# Patient Record
Sex: Male | Born: 1965 | Race: White | Hispanic: No | Marital: Married | Smoking: Former smoker
Health system: Southern US, Community
[De-identification: ages and names within clinical notes are randomized; demographics above are authoritative.]

## PROBLEM LIST (undated history)

## (undated) DIAGNOSIS — Z8719 Personal history of other diseases of the digestive system: Secondary | ICD-10-CM

## (undated) DIAGNOSIS — H269 Unspecified cataract: Secondary | ICD-10-CM

## (undated) DIAGNOSIS — R569 Unspecified convulsions: Secondary | ICD-10-CM

## (undated) DIAGNOSIS — K219 Gastro-esophageal reflux disease without esophagitis: Secondary | ICD-10-CM

## (undated) DIAGNOSIS — T7840XA Allergy, unspecified, initial encounter: Secondary | ICD-10-CM

## (undated) DIAGNOSIS — F1010 Alcohol abuse, uncomplicated: Secondary | ICD-10-CM

## (undated) HISTORY — DX: Allergy, unspecified, initial encounter: T78.40XA

## (undated) HISTORY — DX: Unspecified convulsions: R56.9

## (undated) HISTORY — PX: CATARACT EXTRACTION: SUR2

## (undated) HISTORY — DX: Gastro-esophageal reflux disease without esophagitis: K21.9

## (undated) HISTORY — DX: Unspecified cataract: H26.9

## (undated) HISTORY — DX: Alcohol abuse, uncomplicated: F10.10

---

## 1999-10-06 ENCOUNTER — Encounter: Admission: RE | Admit: 1999-10-06 | Discharge: 1999-10-06 | Payer: Self-pay | Admitting: Emergency Medicine

## 1999-10-06 ENCOUNTER — Encounter: Payer: Self-pay | Admitting: Emergency Medicine

## 2007-03-02 ENCOUNTER — Encounter: Admission: RE | Admit: 2007-03-02 | Discharge: 2007-03-02 | Payer: Self-pay | Admitting: Emergency Medicine

## 2009-01-19 ENCOUNTER — Inpatient Hospital Stay (HOSPITAL_COMMUNITY): Admission: EM | Admit: 2009-01-19 | Discharge: 2009-02-06 | Payer: Self-pay | Admitting: Emergency Medicine

## 2009-01-19 HISTORY — PX: OTHER SURGICAL HISTORY: SHX169

## 2009-01-27 ENCOUNTER — Ambulatory Visit: Payer: Self-pay | Admitting: Physical Medicine & Rehabilitation

## 2009-01-29 ENCOUNTER — Encounter (INDEPENDENT_AMBULATORY_CARE_PROVIDER_SITE_OTHER): Payer: Self-pay | Admitting: General Surgery

## 2009-01-29 ENCOUNTER — Ambulatory Visit: Payer: Self-pay | Admitting: Vascular Surgery

## 2009-02-05 ENCOUNTER — Encounter (INDEPENDENT_AMBULATORY_CARE_PROVIDER_SITE_OTHER): Payer: Self-pay | Admitting: General Surgery

## 2009-02-06 ENCOUNTER — Ambulatory Visit: Payer: Self-pay | Admitting: Physical Medicine & Rehabilitation

## 2009-02-06 ENCOUNTER — Inpatient Hospital Stay (HOSPITAL_COMMUNITY)
Admission: RE | Admit: 2009-02-06 | Discharge: 2009-03-10 | Payer: Self-pay | Admitting: Physical Medicine & Rehabilitation

## 2009-03-16 ENCOUNTER — Observation Stay (HOSPITAL_COMMUNITY): Admission: EM | Admit: 2009-03-16 | Discharge: 2009-03-17 | Payer: Self-pay | Admitting: Emergency Medicine

## 2009-03-18 ENCOUNTER — Encounter
Admission: RE | Admit: 2009-03-18 | Discharge: 2009-06-16 | Payer: Self-pay | Admitting: Physical Medicine & Rehabilitation

## 2009-04-04 ENCOUNTER — Emergency Department (HOSPITAL_COMMUNITY): Admission: EM | Admit: 2009-04-04 | Discharge: 2009-04-04 | Payer: Self-pay | Admitting: Emergency Medicine

## 2009-04-07 ENCOUNTER — Encounter
Admission: RE | Admit: 2009-04-07 | Discharge: 2009-04-08 | Payer: Self-pay | Admitting: Physical Medicine & Rehabilitation

## 2009-04-08 ENCOUNTER — Ambulatory Visit: Payer: Self-pay | Admitting: Physical Medicine & Rehabilitation

## 2009-04-10 ENCOUNTER — Emergency Department (HOSPITAL_COMMUNITY): Admission: EM | Admit: 2009-04-10 | Discharge: 2009-04-10 | Payer: Self-pay | Admitting: Emergency Medicine

## 2009-05-13 ENCOUNTER — Ambulatory Visit: Payer: Self-pay | Admitting: Vascular Surgery

## 2009-07-08 ENCOUNTER — Encounter
Admission: RE | Admit: 2009-07-08 | Discharge: 2009-09-30 | Payer: Self-pay | Admitting: Physical Medicine & Rehabilitation

## 2009-07-08 ENCOUNTER — Ambulatory Visit: Payer: Self-pay | Admitting: Physical Medicine & Rehabilitation

## 2009-08-24 ENCOUNTER — Ambulatory Visit: Payer: Self-pay | Admitting: Psychology

## 2009-09-30 ENCOUNTER — Ambulatory Visit: Payer: Self-pay | Admitting: Physical Medicine & Rehabilitation
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
## 2010-10-08 LAB — URINALYSIS, ROUTINE W REFLEX MICROSCOPIC
Bilirubin Urine: NEGATIVE
Glucose, UA: NEGATIVE mg/dL
Glucose, UA: NEGATIVE mg/dL
Hgb urine dipstick: NEGATIVE
Specific Gravity, Urine: 1.015 (ref 1.005–1.030)
Specific Gravity, Urine: 1.02 (ref 1.005–1.030)
pH: 5.5 (ref 5.0–8.0)

## 2010-10-08 LAB — DIFFERENTIAL
Basophils Relative: 1 % (ref 0–1)
Lymphocytes Relative: 16 % (ref 12–46)
Lymphs Abs: 1.8 10*3/uL (ref 0.7–4.0)
Monocytes Absolute: 0.7 10*3/uL (ref 0.1–1.0)
Monocytes Relative: 6 % (ref 3–12)
Neutro Abs: 8.3 10*3/uL — ABNORMAL HIGH (ref 1.7–7.7)

## 2010-10-08 LAB — RAPID URINE DRUG SCREEN, HOSP PERFORMED
Amphetamines: NOT DETECTED
Cocaine: NOT DETECTED
Opiates: NOT DETECTED
Tetrahydrocannabinol: NOT DETECTED

## 2010-10-08 LAB — COMPREHENSIVE METABOLIC PANEL
Albumin: 3.5 g/dL (ref 3.5–5.2)
Alkaline Phosphatase: 131 U/L — ABNORMAL HIGH (ref 39–117)
BUN: 12 mg/dL (ref 6–23)
Potassium: 3.5 mEq/L (ref 3.5–5.1)
Sodium: 141 mEq/L (ref 135–145)
Total Protein: 6.6 g/dL (ref 6.0–8.3)

## 2010-10-08 LAB — URINE MICROSCOPIC-ADD ON

## 2010-10-08 LAB — CBC
HCT: 41.3 % (ref 39.0–52.0)
Platelets: 357 10*3/uL (ref 150–400)
RDW: 13.9 % (ref 11.5–15.5)

## 2010-10-08 LAB — URINE CULTURE
Colony Count: NO GROWTH
Culture: NO GROWTH

## 2010-10-08 LAB — APTT: aPTT: 27 seconds (ref 24–37)

## 2010-10-09 LAB — COMPREHENSIVE METABOLIC PANEL
ALT: 114 U/L — ABNORMAL HIGH (ref 0–53)
AST: 51 U/L — ABNORMAL HIGH (ref 0–37)
Albumin: 3.2 g/dL — ABNORMAL LOW (ref 3.5–5.2)
Alkaline Phosphatase: 357 U/L — ABNORMAL HIGH (ref 39–117)
GFR calc Af Amer: >60 mL/min (ref >60)
Glucose, Bld: 85 mg/dL (ref 70–99)
Potassium: 4 mEq/L (ref 3.5–5.1)
Sodium: 137 mEq/L (ref 135–145)
Total Protein: 6.9 g/dL (ref 6.0–8.3)

## 2010-10-09 LAB — BASIC METABOLIC PANEL
BUN: 10 mg/dL (ref 6–23)
BUN: 10 mg/dL (ref 6–23)
BUN: 10 mg/dL (ref 6–23)
BUN: 9 mg/dL (ref 6–23)
CO2: 27 mEq/L (ref 19–32)
CO2: 28 mEq/L (ref 19–32)
CO2: 28 mEq/L (ref 19–32)
CO2: 28 mEq/L (ref 19–32)
Chloride: 100 mEq/L (ref 96–112)
Chloride: 100 mEq/L (ref 96–112)
Chloride: 100 mEq/L (ref 96–112)
Chloride: 97 mEq/L (ref 96–112)
Creatinine, Ser: 0.58 mg/dL (ref 0.4–1.5)
Creatinine, Ser: 0.6 mg/dL (ref 0.4–1.5)
GFR calc Af Amer: >60 mL/min (ref >60)
Glucose, Bld: 102 mg/dL — ABNORMAL HIGH (ref 70–99)
Glucose, Bld: 144 mg/dL — ABNORMAL HIGH (ref 70–99)
Potassium: 4.1 mEq/L (ref 3.5–5.1)
Potassium: 4.2 mEq/L (ref 3.5–5.1)
Potassium: 4.3 mEq/L (ref 3.5–5.1)

## 2010-10-09 LAB — URINALYSIS, ROUTINE W REFLEX MICROSCOPIC
Glucose, UA: NEGATIVE mg/dL
Hgb urine dipstick: NEGATIVE
Ketones, ur: NEGATIVE mg/dL
Protein, ur: NEGATIVE mg/dL
pH: 6.5 (ref 5.0–8.0)

## 2010-10-09 LAB — MRSA CULTURE

## 2010-10-09 LAB — DIFFERENTIAL
Basophils Relative: 4 % — ABNORMAL HIGH (ref 0–1)
Eosinophils Absolute: 0.6 10*3/uL (ref 0.0–0.7)
Eosinophils Relative: 8 % — ABNORMAL HIGH (ref 0–5)
Monocytes Absolute: 0.8 10*3/uL (ref 0.1–1.0)
Monocytes Relative: 9 % (ref 3–12)

## 2010-10-09 LAB — HEPATIC FUNCTION PANEL
ALT: 128 U/L — ABNORMAL HIGH (ref 0–53)
Bilirubin, Direct: 0.1 mg/dL (ref 0.0–0.3)
Indirect Bilirubin: 0.7 mg/dL (ref 0.3–0.9)

## 2010-10-09 LAB — CBC
HCT: 38.5 % — ABNORMAL LOW (ref 39.0–52.0)
Hemoglobin: 13.1 g/dL (ref 13.0–17.0)
Hemoglobin: 14.6 g/dL (ref 13.0–17.0)
MCHC: 33.9 g/dL (ref 30.0–36.0)
MCHC: 34.2 g/dL (ref 30.0–36.0)
MCV: 95.5 fL (ref 78.0–100.0)
Platelets: 595 10*3/uL — ABNORMAL HIGH (ref 150–400)
RBC: 4.03 MIL/uL — ABNORMAL LOW (ref 4.22–5.81)
RDW: 12.9 % (ref 11.5–15.5)
RDW: 13.3 % (ref 11.5–15.5)

## 2010-10-09 LAB — URINE CULTURE: Colony Count: >=100000

## 2010-10-09 LAB — PHENYTOIN LEVEL, TOTAL: Phenytoin Lvl: 4.4 ug/mL — ABNORMAL LOW (ref 10.0–20.0)

## 2010-10-10 LAB — BASIC METABOLIC PANEL
CO2: 24 mEq/L (ref 19–32)
CO2: 24 mEq/L (ref 19–32)
CO2: 24 mEq/L (ref 19–32)
CO2: 25 mEq/L (ref 19–32)
Calcium: 8 mg/dL — ABNORMAL LOW (ref 8.4–10.5)
Calcium: 8.1 mg/dL — ABNORMAL LOW (ref 8.4–10.5)
Calcium: 8.5 mg/dL (ref 8.4–10.5)
Chloride: 102 mEq/L (ref 96–112)
Chloride: 111 mEq/L (ref 96–112)
Chloride: 113 mEq/L — ABNORMAL HIGH (ref 96–112)
Creatinine, Ser: 0.74 mg/dL (ref 0.4–1.5)
Creatinine, Ser: 0.92 mg/dL (ref 0.4–1.5)
Creatinine, Ser: 0.92 mg/dL (ref 0.4–1.5)
Creatinine, Ser: 1.02 mg/dL (ref 0.4–1.5)
GFR calc Af Amer: >60 mL/min (ref >60)
GFR calc Af Amer: >60 mL/min (ref >60)
GFR calc Af Amer: >60 mL/min (ref >60)
GFR calc Af Amer: >60 mL/min (ref >60)
GFR calc non Af Amer: >60 mL/min (ref >60)
GFR calc non Af Amer: >60 mL/min (ref >60)
GFR calc non Af Amer: >60 mL/min (ref >60)
Glucose, Bld: 105 mg/dL — ABNORMAL HIGH (ref 70–99)
Glucose, Bld: 114 mg/dL — ABNORMAL HIGH (ref 70–99)
Glucose, Bld: 138 mg/dL — ABNORMAL HIGH (ref 70–99)
Glucose, Bld: 181 mg/dL — ABNORMAL HIGH (ref 70–99)
Potassium: 3.6 mEq/L (ref 3.5–5.1)
Potassium: 3.7 mEq/L (ref 3.5–5.1)
Sodium: 135 mEq/L (ref 135–145)
Sodium: 142 mEq/L (ref 135–145)
Sodium: 143 mEq/L (ref 135–145)

## 2010-10-10 LAB — DIFFERENTIAL
Basophils Absolute: 0 10*3/uL (ref 0.0–0.1)
Basophils Relative: 0 % (ref 0–1)
Eosinophils Absolute: 0.8 10*3/uL — ABNORMAL HIGH (ref 0.0–0.7)
Lymphs Abs: 1 10*3/uL (ref 0.7–4.0)
Neutro Abs: 11.2 10*3/uL — ABNORMAL HIGH (ref 1.7–7.7)

## 2010-10-10 LAB — GLUCOSE, CAPILLARY
Glucose-Capillary: 100 mg/dL — ABNORMAL HIGH (ref 70–99)
Glucose-Capillary: 101 mg/dL — ABNORMAL HIGH (ref 70–99)
Glucose-Capillary: 106 mg/dL — ABNORMAL HIGH (ref 70–99)
Glucose-Capillary: 106 mg/dL — ABNORMAL HIGH (ref 70–99)
Glucose-Capillary: 107 mg/dL — ABNORMAL HIGH (ref 70–99)
Glucose-Capillary: 109 mg/dL — ABNORMAL HIGH (ref 70–99)
Glucose-Capillary: 109 mg/dL — ABNORMAL HIGH (ref 70–99)
Glucose-Capillary: 116 mg/dL — ABNORMAL HIGH (ref 70–99)
Glucose-Capillary: 119 mg/dL — ABNORMAL HIGH (ref 70–99)
Glucose-Capillary: 121 mg/dL — ABNORMAL HIGH (ref 70–99)
Glucose-Capillary: 122 mg/dL — ABNORMAL HIGH (ref 70–99)
Glucose-Capillary: 123 mg/dL — ABNORMAL HIGH (ref 70–99)
Glucose-Capillary: 124 mg/dL — ABNORMAL HIGH (ref 70–99)
Glucose-Capillary: 127 mg/dL — ABNORMAL HIGH (ref 70–99)
Glucose-Capillary: 133 mg/dL — ABNORMAL HIGH (ref 70–99)
Glucose-Capillary: 133 mg/dL — ABNORMAL HIGH (ref 70–99)
Glucose-Capillary: 136 mg/dL — ABNORMAL HIGH (ref 70–99)
Glucose-Capillary: 143 mg/dL — ABNORMAL HIGH (ref 70–99)
Glucose-Capillary: 158 mg/dL — ABNORMAL HIGH (ref 70–99)
Glucose-Capillary: 159 mg/dL — ABNORMAL HIGH (ref 70–99)
Glucose-Capillary: 93 mg/dL (ref 70–99)
Glucose-Capillary: 93 mg/dL (ref 70–99)
Glucose-Capillary: 96 mg/dL (ref 70–99)

## 2010-10-10 LAB — BLOOD GAS, ARTERIAL
Acid-Base Excess: 1.6 mmol/L (ref 0.0–2.0)
Acid-Base Excess: 2.7 mmol/L — ABNORMAL HIGH (ref 0.0–2.0)
Bicarbonate: 20.3 mEq/L (ref 20.0–24.0)
Bicarbonate: 25.6 mEq/L — ABNORMAL HIGH (ref 20.0–24.0)
Bicarbonate: 27.6 mEq/L — ABNORMAL HIGH (ref 20.0–24.0)
Drawn by: 249101
Drawn by: 290241
Drawn by: 317771
FIO2: 0.4 %
FIO2: 0.4 %
MECHVT: 600 mL
MECHVT: 600 mL
MECHVT: 620 mL
MECHVT: 620 mL
MECHVT: 620 mL
MECHVT: 620 mL
O2 Saturation: 98.4 %
O2 Saturation: 98.6 %
O2 Saturation: 98.7 %
PEEP: 5 cmH2O
PEEP: 5 cmH2O
PEEP: 5 cmH2O
PEEP: 5 cmH2O
PEEP: 5 cmH2O
Patient temperature: 101
Patient temperature: 97.2
Patient temperature: 98.6
Patient temperature: 98.6
Patient temperature: 98.6
RATE: 12 resp/min
RATE: 12 resp/min
RATE: 12 resp/min
RATE: 14 resp/min
RATE: 16 resp/min
RATE: 16 resp/min
TCO2: 21.1 mmol/L (ref 0–100)
TCO2: 26.9 mmol/L (ref 0–100)
TCO2: 27.3 mmol/L (ref 0–100)
TCO2: 29.2 mmol/L (ref 0–100)
pCO2 arterial: 25.6 mmHg — ABNORMAL LOW (ref 35.0–45.0)
pCO2 arterial: 29.9 mmHg — ABNORMAL LOW (ref 35.0–45.0)
pCO2 arterial: 32.3 mmHg — ABNORMAL LOW (ref 35.0–45.0)
pCO2 arterial: 40.3 mmHg (ref 35.0–45.0)
pCO2 arterial: 48.1 mmHg — ABNORMAL HIGH (ref 35.0–45.0)
pCO2 arterial: 50.3 mmHg — ABNORMAL HIGH (ref 35.0–45.0)
pH, Arterial: 7.344 — ABNORMAL LOW (ref 7.350–7.450)
pH, Arterial: 7.359 (ref 7.350–7.450)
pH, Arterial: 7.396 (ref 7.350–7.450)
pH, Arterial: 7.419 (ref 7.350–7.450)
pH, Arterial: 7.432 (ref 7.350–7.450)
pH, Arterial: 7.484 — ABNORMAL HIGH (ref 7.350–7.450)
pH, Arterial: 7.51 — ABNORMAL HIGH (ref 7.350–7.450)
pO2, Arterial: 103 mmHg — ABNORMAL HIGH (ref 80.0–100.0)
pO2, Arterial: 123 mmHg — ABNORMAL HIGH (ref 80.0–100.0)
pO2, Arterial: 165 mmHg — ABNORMAL HIGH (ref 80.0–100.0)

## 2010-10-10 LAB — COMPREHENSIVE METABOLIC PANEL
ALT: 46 U/L (ref 0–53)
ALT: 52 U/L (ref 0–53)
AST: 43 U/L — ABNORMAL HIGH (ref 0–37)
AST: 47 U/L — ABNORMAL HIGH (ref 0–37)
Albumin: 2.7 g/dL — ABNORMAL LOW (ref 3.5–5.2)
Albumin: 3.2 g/dL — ABNORMAL LOW (ref 3.5–5.2)
Alkaline Phosphatase: 115 U/L (ref 39–117)
Alkaline Phosphatase: 51 U/L (ref 39–117)
BUN: 13 mg/dL (ref 6–23)
CO2: 25 mEq/L (ref 19–32)
Calcium: 8.1 mg/dL — ABNORMAL LOW (ref 8.4–10.5)
Chloride: 105 mEq/L (ref 96–112)
Chloride: 105 mEq/L (ref 96–112)
Creatinine, Ser: 0.97 mg/dL (ref 0.4–1.5)
GFR calc Af Amer: >60 mL/min (ref >60)
GFR calc Af Amer: >60 mL/min (ref >60)
GFR calc non Af Amer: >60 mL/min (ref >60)
Glucose, Bld: 134 mg/dL — ABNORMAL HIGH (ref 70–99)
Potassium: 3.7 mEq/L (ref 3.5–5.1)
Potassium: 4.3 mEq/L (ref 3.5–5.1)
Sodium: 134 mEq/L — ABNORMAL LOW (ref 135–145)
Sodium: 136 mEq/L (ref 135–145)
Total Bilirubin: 0.8 mg/dL (ref 0.3–1.2)
Total Bilirubin: 1 mg/dL (ref 0.3–1.2)
Total Protein: 5.5 g/dL — ABNORMAL LOW (ref 6.0–8.3)
Total Protein: 6.5 g/dL (ref 6.0–8.3)

## 2010-10-10 LAB — CBC
HCT: 26.9 % — ABNORMAL LOW (ref 39.0–52.0)
HCT: 34.8 % — ABNORMAL LOW (ref 39.0–52.0)
HCT: 36.3 % — ABNORMAL LOW (ref 39.0–52.0)
HCT: 40.7 % (ref 39.0–52.0)
Hemoglobin: 10.7 g/dL — ABNORMAL LOW (ref 13.0–17.0)
Hemoglobin: 11.8 g/dL — ABNORMAL LOW (ref 13.0–17.0)
Hemoglobin: 12.5 g/dL — ABNORMAL LOW (ref 13.0–17.0)
Hemoglobin: 12.6 g/dL — ABNORMAL LOW (ref 13.0–17.0)
Hemoglobin: 13.9 g/dL (ref 13.0–17.0)
MCHC: 33.9 g/dL (ref 30.0–36.0)
MCHC: 34.3 g/dL (ref 30.0–36.0)
MCHC: 34.5 g/dL (ref 30.0–36.0)
MCHC: 34.7 g/dL (ref 30.0–36.0)
MCHC: 34.7 g/dL (ref 30.0–36.0)
MCHC: 34.7 g/dL (ref 30.0–36.0)
MCHC: 35.1 g/dL (ref 30.0–36.0)
MCHC: 35.1 g/dL (ref 30.0–36.0)
MCV: 94.6 fL (ref 78.0–100.0)
MCV: 94.9 fL (ref 78.0–100.0)
MCV: 94.9 fL (ref 78.0–100.0)
MCV: 95 fL (ref 78.0–100.0)
MCV: 95 fL (ref 78.0–100.0)
MCV: 95.6 fL (ref 78.0–100.0)
MCV: 96.1 fL (ref 78.0–100.0)
Platelets: 162 10*3/uL (ref 150–400)
Platelets: 163 10*3/uL (ref 150–400)
Platelets: 179 10*3/uL (ref 150–400)
Platelets: 203 10*3/uL (ref 150–400)
Platelets: ADEQUATE 10*3/uL (ref 150–400)
Platelets: ADEQUATE 10*3/uL (ref 150–400)
RBC: 2.8 MIL/uL — ABNORMAL LOW (ref 4.22–5.81)
RBC: 3.21 MIL/uL — ABNORMAL LOW (ref 4.22–5.81)
RBC: 3.48 MIL/uL — ABNORMAL LOW (ref 4.22–5.81)
RBC: 3.76 MIL/uL — ABNORMAL LOW (ref 4.22–5.81)
RBC: 3.84 MIL/uL — ABNORMAL LOW (ref 4.22–5.81)
RBC: 4.26 MIL/uL (ref 4.22–5.81)
RBC: 5.07 MIL/uL (ref 4.22–5.81)
RDW: 12.9 % (ref 11.5–15.5)
RDW: 13.2 % (ref 11.5–15.5)
RDW: 13.3 % (ref 11.5–15.5)
RDW: 13.4 % (ref 11.5–15.5)
RDW: 13.4 % (ref 11.5–15.5)
WBC: 10.9 10*3/uL — ABNORMAL HIGH (ref 4.0–10.5)
WBC: 11.7 10*3/uL — ABNORMAL HIGH (ref 4.0–10.5)
WBC: 14 10*3/uL — ABNORMAL HIGH (ref 4.0–10.5)
WBC: 8.9 10*3/uL (ref 4.0–10.5)
WBC: 9.7 10*3/uL (ref 4.0–10.5)

## 2010-10-10 LAB — URINE MICROSCOPIC-ADD ON

## 2010-10-10 LAB — URINALYSIS, ROUTINE W REFLEX MICROSCOPIC
Bilirubin Urine: NEGATIVE
Ketones, ur: 15 mg/dL — AB
Nitrite: NEGATIVE
Nitrite: NEGATIVE
Specific Gravity, Urine: 1.022 (ref 1.005–1.030)
pH: 5.5 (ref 5.0–8.0)
pH: 6 (ref 5.0–8.0)

## 2010-10-10 LAB — URINE CULTURE
Colony Count: NO GROWTH
Culture: NO GROWTH

## 2010-10-10 LAB — CULTURE, RESPIRATORY W GRAM STAIN

## 2010-10-10 LAB — POCT I-STAT, CHEM 8
BUN: 14 mg/dL (ref 6–23)
Calcium, Ion: 1.07 mmol/L — ABNORMAL LOW (ref 1.12–1.32)
Chloride: 105 mEq/L (ref 96–112)
Creatinine, Ser: 1.1 mg/dL (ref 0.4–1.5)
Glucose, Bld: 145 mg/dL — ABNORMAL HIGH (ref 70–99)
Potassium: 3.8 mEq/L (ref 3.5–5.1)

## 2010-10-10 LAB — SAMPLE TO BLOOD BANK

## 2010-10-10 LAB — URINALYSIS, MICROSCOPIC ONLY
Glucose, UA: NEGATIVE mg/dL
Specific Gravity, Urine: 1.031 — ABNORMAL HIGH (ref 1.005–1.030)
pH: 6 (ref 5.0–8.0)

## 2010-10-10 LAB — CULTURE, BLOOD (ROUTINE X 2)
Culture: NO GROWTH
Culture: NO GROWTH
Culture: NO GROWTH

## 2010-10-10 LAB — PROTIME-INR
Prothrombin Time: 12.6 seconds (ref 11.6–15.2)
Prothrombin Time: 14.4 seconds (ref 11.6–15.2)

## 2010-10-10 LAB — APTT: aPTT: 25 seconds (ref 24–37)

## 2010-10-10 LAB — HEMOGLOBIN A1C: Hgb A1c MFr Bld: 5.6 % (ref 4.6–6.1)

## 2010-10-10 LAB — LACTIC ACID, PLASMA: Lactic Acid, Venous: 2.6 mmol/L — ABNORMAL HIGH (ref 0.5–2.2)
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
## 2012-04-06 HISTORY — PX: ROTATOR CUFF REPAIR: SHX139

## 2012-08-14 ENCOUNTER — Encounter: Payer: Self-pay | Admitting: Gastroenterology

## 2012-08-15 ENCOUNTER — Encounter: Payer: Self-pay | Admitting: *Deleted

## 2012-08-23 ENCOUNTER — Encounter: Payer: Self-pay | Admitting: Gastroenterology

## 2012-08-23 ENCOUNTER — Ambulatory Visit (INDEPENDENT_AMBULATORY_CARE_PROVIDER_SITE_OTHER): Payer: Managed Care, Other (non HMO) | Admitting: Gastroenterology

## 2012-08-23 ENCOUNTER — Other Ambulatory Visit (INDEPENDENT_AMBULATORY_CARE_PROVIDER_SITE_OTHER): Payer: Managed Care, Other (non HMO)

## 2012-08-23 VITALS — BP 124/94 | HR 79 | Ht 74.25 in | Wt 250.2 lb

## 2012-08-23 DIAGNOSIS — F1010 Alcohol abuse, uncomplicated: Secondary | ICD-10-CM

## 2012-08-23 DIAGNOSIS — K219 Gastro-esophageal reflux disease without esophagitis: Secondary | ICD-10-CM

## 2012-08-23 DIAGNOSIS — R079 Chest pain, unspecified: Secondary | ICD-10-CM

## 2012-08-23 LAB — FERRITIN: Ferritin: 370.3 ng/mL — ABNORMAL HIGH (ref 22.0–322.0)

## 2012-08-23 LAB — COMPREHENSIVE METABOLIC PANEL
AST: 56 U/L — ABNORMAL HIGH (ref 0–37)
Albumin: 4.2 g/dL (ref 3.5–5.2)
BUN: 15 mg/dL (ref 6–23)
CO2: 29 mEq/L (ref 19–32)
Calcium: 9.5 mg/dL (ref 8.4–10.5)
Chloride: 102 mEq/L (ref 96–112)
Creatinine, Ser: 1 mg/dL (ref 0.4–1.5)
GFR: 80.88 mL/min (ref >60.00)
Glucose, Bld: 100 mg/dL — ABNORMAL HIGH (ref 70–99)
Potassium: 4.7 mEq/L (ref 3.5–5.1)

## 2012-08-23 LAB — CBC WITH DIFFERENTIAL/PLATELET
Basophils Relative: 1.2 % (ref 0.0–3.0)
Eosinophils Absolute: 0.6 10*3/uL (ref 0.0–0.7)
Eosinophils Relative: 8.2 % — ABNORMAL HIGH (ref 0.0–5.0)
Hemoglobin: 16 g/dL (ref 13.0–17.0)
Lymphocytes Relative: 33.8 % (ref 12.0–46.0)
MCHC: 34.8 g/dL (ref 30.0–36.0)
Monocytes Relative: 10.8 % (ref 3.0–12.0)
Neutro Abs: 3.3 10*3/uL (ref 1.4–7.7)
Neutrophils Relative %: 46 % (ref 43.0–77.0)
RBC: 4.88 Mil/uL (ref 4.22–5.81)
WBC: 7.2 10*3/uL (ref 4.5–10.5)

## 2012-08-23 LAB — HEPATIC FUNCTION PANEL
Albumin: 4.2 g/dL (ref 3.5–5.2)
Alkaline Phosphatase: 64 U/L (ref 39–117)
Total Protein: 7 g/dL (ref 6.0–8.3)

## 2012-08-23 LAB — IBC PANEL
Iron: 213 ug/dL — ABNORMAL HIGH (ref 42–165)
Transferrin: 289.8 mg/dL (ref 212.0–360.0)

## 2012-08-23 LAB — VITAMIN B12: Vitamin B-12: 277 pg/mL (ref 211–911)

## 2012-08-23 LAB — FOLATE: Folate: 18.5 ng/mL (ref >5.9)
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
## 2012-08-23 NOTE — Patient Instructions (Addendum)
Your physician has requested that you go to the basement for the following lab work before leaving today: Anemia Panel and [place] [facility] Panel.  You have been scheduled for an endoscopy with propofol. Please follow written instructions given to you at your visit today. If you use inhalers (even only as needed) or a CPAP machine, please bring them with you on the day of your procedure.  Please stay on your Protonix.  You watched a video today on acid reflux  Information on acid reflux below.   ___________________________________________________________________________________________________________________  Diet for Gastroesophageal Reflux Disease, Adult Reflux (acid reflux) is when acid from your stomach flows up into the esophagus. When acid comes in contact with the esophagus, the acid causes irritation and soreness (inflammation) in the esophagus. When reflux happens often or so severely that it causes damage to the esophagus, it is called gastroesophageal reflux disease (GERD). Nutrition therapy can help ease the discomfort of GERD. FOODS OR DRINKS TO AVOID OR LIMIT  Smoking or chewing tobacco. Nicotine is one of the most potent stimulants to acid production in the gastrointestinal tract.  Caffeinated and decaffeinated coffee and black tea.  Regular or low-calorie carbonated beverages or energy drinks (caffeine-free carbonated beverages are allowed).   Strong spices, such as black pepper, white pepper, red pepper, cayenne, curry powder, and chili powder.  Peppermint or spearmint.  Chocolate.  High-fat foods, including meats and fried foods. Extra added fats including oils, butter, salad dressings, and nuts. Limit these to less than 8 tsp per day.  Fruits and vegetables if they are not tolerated, such as citrus fruits or tomatoes.  Alcohol.  Any food that seems to aggravate your condition. If you have questions regarding your diet, call your caregiver or a registered  dietitian. OTHER THINGS THAT MAY HELP GERD INCLUDE:   Eating your meals slowly, in a relaxed setting.  Eating 5 to 6 small meals per day instead of 3 large meals.  Eliminating food for a period of time if it causes distress.  Not lying down until 3 hours after eating a meal.  Keeping the head of your bed raised 6 to 9 inches (15 to 23 cm) by using a foam wedge or blocks under the legs of the bed. Lying flat may make symptoms worse.  Being physically active. Weight loss may be helpful in reducing reflux in overweight or obese adults.  Wear loose fitting clothing EXAMPLE MEAL PLAN This meal plan is approximately 2,000 calories based on https://www.[facility].org/ meal planning guidelines. Breakfast   cup cooked oatmeal.  1 cup strawberries.  1 cup low-fat milk.  1 oz almonds. Snack  1 cup cucumber slices.  6 oz yogurt (made from low-fat or fat-free milk). Lunch  2 slice whole-wheat bread.  2 oz sliced [place].  2 tsp mayonnaise.  1 cup blueberries.  1 cup snap peas. Snack  6 whole-wheat crackers.  1 oz string cheese. Dinner   cup brown rice.  1 cup mixed veggies.  1 tsp olive oil.  3 oz grilled fish. Document Released: 06/20/2005 Document Revised: 09/12/2011 Document Reviewed: 05/06/2011 [facility] Patient Information 2013 [place], [place].

## 2012-08-28 ENCOUNTER — Telehealth: Payer: Self-pay | Admitting: *Deleted

## 2012-08-28 DIAGNOSIS — R945 Abnormal results of liver function studies: Secondary | ICD-10-CM

## 2012-08-28 DIAGNOSIS — F1010 Alcohol abuse, uncomplicated: Secondary | ICD-10-CM

## 2012-08-28 DIAGNOSIS — R7989 Other specified abnormal findings of blood chemistry: Secondary | ICD-10-CM

## 2012-08-28 NOTE — Telephone Encounter (Signed)
Informed pt of lab results and that Dr Jarold Motto would like an abdominal u/s and more labs. Pt stated understanding. U/S scheduled for 08/30/12 at 0800, arrive at 0745am and NPO after midnight. Pt will try to have his labs drawn on that day as well.

## 2012-08-28 NOTE — Telephone Encounter (Signed)
Message copied by Florene Glen on Tue Aug 28, 2012  3:48 PM ------      Message from: PATTERSON, DAVID R      Created: Fri Aug 24, 2012  8:43 AM       significant elevation of his liver enzymes and needs ultrasound of his upper abdomen, hepatitis C antibody, hepatitis B surface antigen, and hemochromatosis genetics and ceruloplasmin level.  Also to [place] blood work checked CDT to determine extent of alcohol use. ------

## 2012-08-30 ENCOUNTER — Other Ambulatory Visit: Payer: Managed Care, Other (non HMO)

## 2012-08-30 ENCOUNTER — Ambulatory Visit (HOSPITAL_COMMUNITY)
Admission: RE | Admit: 2012-08-30 | Discharge: 2012-08-30 | Disposition: A | Discharge status: Home / Self Care | Payer: Managed Care, Other (non HMO) | Source: Ambulatory Visit | Attending: Gastroenterology | Admitting: Gastroenterology

## 2012-08-30 DIAGNOSIS — F1010 Alcohol abuse, uncomplicated: Secondary | ICD-10-CM

## 2012-08-30 DIAGNOSIS — Q638 Other specified congenital malformations of kidney: Secondary | ICD-10-CM | POA: Insufficient documentation

## 2012-08-30 DIAGNOSIS — R7989 Other specified abnormal findings of blood chemistry: Secondary | ICD-10-CM

## 2012-08-30 DIAGNOSIS — R945 Abnormal results of liver function studies: Secondary | ICD-10-CM

## 2012-09-01 LAB — CARBOHYDRATE DEFICIENT TRANSFERRIN
%CDT: 1.5 % (ref <2.5)
CDT: 39.2 mg/L (ref 28.1–76.0)

## 2012-09-01 LAB — CERULOPLASMIN: Ceruloplasmin: 21 mg/dL (ref 20–60)

## 2012-09-14 ENCOUNTER — Encounter: Payer: Self-pay | Admitting: Gastroenterology

## 2012-09-14 ENCOUNTER — Ambulatory Visit (AMBULATORY_SURGERY_CENTER): Payer: Managed Care, Other (non HMO) | Admitting: Gastroenterology

## 2012-09-14 VITALS — BP 138/94 | HR 74 | Temp 97.7°F | Resp 17 | Ht 74.25 in | Wt 250.0 lb

## 2012-09-14 DIAGNOSIS — K2970 Gastritis, unspecified, without bleeding: Secondary | ICD-10-CM

## 2012-09-14 DIAGNOSIS — K2990 Gastroduodenitis, unspecified, without bleeding: Secondary | ICD-10-CM

## 2012-09-14 DIAGNOSIS — F1010 Alcohol abuse, uncomplicated: Secondary | ICD-10-CM

## 2012-09-14 DIAGNOSIS — K219 Gastro-esophageal reflux disease without esophagitis: Secondary | ICD-10-CM

## 2012-09-14 MED ORDER — SODIUM CHLORIDE 0.9 % IV SOLN: 500.0000 mL | INTRAVENOUS | Status: DC

## 2012-09-14 NOTE — Progress Notes (Signed)
Called to room to assist during endoscopic procedure.  Patient ID and intended procedure confirmed with present staff. Received instructions for my participation in the procedure from the performing physician. ewm 

## 2012-09-14 NOTE — Progress Notes (Signed)
Patient did not experience any of the following events: a burn prior to discharge; a fall within the facility; wrong site/side/patient/procedure/implant event; or a hospital transfer or hospital admission upon discharge from the facility. (G8907) Patient did not have preoperative order for IV antibiotic SSI prophylaxis. (G8918)  

## 2012-09-14 NOTE — Patient Instructions (Addendum)
YOU HAD AN ENDOSCOPIC PROCEDURE TODAY AT THE [facility]: Refer to the procedure report that was given to you for any specific questions about what was found during the examination.  If the procedure report does not answer your questions, please call your gastroenterologist to clarify.  If you requested that your care partner not be given the details of your procedure findings, then the procedure report has been included in a sealed envelope for you to review at your convenience later.  YOU SHOULD EXPECT: Some feelings of bloating in the abdomen. Passage of more gas than usual.  Walking can help get rid of the air that was put into your GI tract during the procedure and reduce the bloating. If you had a lower endoscopy (such as a colonoscopy or flexible sigmoidoscopy) you may notice spotting of blood in your stool or on the toilet paper. If you underwent a bowel prep for your procedure, then you may not have a normal bowel movement for a few days.  DIET: Your first meal following the procedure should be a light meal and then it is ok to progress to your normal diet.  A half-sandwich or bowl of soup is an example of a good first meal.  Heavy or fried foods are harder to digest and may make you feel nauseous or bloated.  Likewise meals heavy in dairy and vegetables can cause extra gas to form and this can also increase the bloating.  Drink plenty of fluids but you should avoid alcoholic beverages for 24 hours.  ACTIVITY: Your care partner should take you home directly after the procedure.  You should plan to take it easy, moving slowly for the rest of the day.  You can resume normal activity the day after the procedure however you should NOT DRIVE or use heavy machinery for 24 hours (because of the sedation medicines used during the test).    SYMPTOMS TO REPORT IMMEDIATELY: A gastroenterologist can be reached at any hour.  During normal business hours, 8:30 AM to 5:00 PM Monday through Friday,  call [phone].  After hours and on weekends, please call the GI answering service at [phone] who will take a message and have the physician on call contact you.    Following upper endoscopy (EGD)  Vomiting of blood or coffee ground material  New chest pain or pain under the shoulder blades  Painful or persistently difficult swallowing  New shortness of breath  Fever of 100F or higher  Black, tarry-looking stools  FOLLOW UP: If any biopsies were taken you will be contacted by phone or by letter within the next 1-3 weeks.  Call your gastroenterologist if you have not heard about the biopsies in 3 weeks.  Our staff will call the home number listed on your records the next business day following your procedure to check on you and address any questions or concerns that you may have at that time regarding the information given to you following your procedure. This is a courtesy call and so if there is no answer at the home number and we have not heard from you through the emergency physician on call, we will assume that you have returned to your regular daily activities without incident.  SIGNATURES/CONFIDENTIALITY: You and/or your care partner have signed paperwork which will be entered into your electronic medical record.  These signatures attest to the fact that that the information above on your After Visit Summary has been reviewed and is understood.  Full   responsibility of the confidentiality of this discharge information lies with you and/or your care-partner.  Hiatal hernia information given. Gastritis information given.,  Take Nexium per Dr. Norval Gable instruction once daily,  Discontinue protonix per Dr. Jarold Motto  Appointment with Dr. Jarold Motto. October 05, 2012 at 8:45AM.

## 2012-09-14 NOTE — Op Note (Addendum)
[facility] [address] [place], [address]   ENDOSCOPY PROCEDURE REPORT  PATIENT: Justin Cooke, Justin Cooke  MR#: [id number] BIRTHDATE: [birth year] , 46  yrs. old GENDER: Male ENDOSCOPIST:David Hale Bogus, MD, [facility] REFERRED BY: Tomi Bamberger, NP PROCEDURE DATE:  09/14/2012 PROCEDURE:   EGD w/ biopsy for H.pylori ASA CLASS:    Class II INDICATIONS: History of reflux esophagitis and history of GERD. MEDICATION: propofol (Diprivan) 200mg  IV TOPICAL ANESTHETIC:  DESCRIPTION OF PROCEDURE:   After the risks and benefits of the procedure were explained, informed consent was obtained.  The LB GIF-H180 [id number]  endoscope was introduced through the mouth  and advanced to the second portion of the duodenum .  The instrument was slowly withdrawn as the mucosa was fully examined.      ESOPHAGUS: A 7-8 cm large hiatal hernia was noted.   The mucosa of the esophagus appeared normal. the mucosa of the esophagus appeared normal without evidence of Barrett's mucosa.  Stomach:  there was severe antral erythema and erosions.  CLO biopsy was done for H. pylori exam.   Retroflexed views revealed a large hiatal hernia.    The scope was then withdrawn from the patient and the procedure completed.  DUODENUM: the duodenal bulb and post bulbar areas appeared normal.  COMPLICATIONS: There were no complications.   ENDOSCOPIC IMPRESSION  :this patient has a large hiatal hernia with erosions in the hernia sac.  There is no evidence of Barrett's mucosa in the esophagus or esophageal stricturing.  Is rather marked antral erythema and erosions consistent with either NSAID damage H. pylori infection.  RECOMMENDATIONS:this patient will need long-term PPI therapy.versus fundoplication because of the large nature of this hiatal hernia. He also has erosive gastritis and erosions in the fundus of the stomach. Only were performed.  We'll continue PPI therapy for now with office followup and  consideration of fundoplication and on his clinical course.  Recent upper abdominal ultrasound exam showed a lobular echodense liver consistent with cirrhosis.  His liver function test or more than twice normal.  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  He'll be office followup per his liver disease, and probable liver biopsy.. [Recommendations]   _______________________________ eSignedMardella Layman, MD, [facility] 09/14/2012 3:25 PM Revised: 09/14/2012 3:25 PM  standard discharge

## 2012-09-17 ENCOUNTER — Telehealth: Payer: Self-pay | Admitting: *Deleted

## 2012-09-17 LAB — HELICOBACTER PYLORI SCREEN-BIOPSY: UREASE: NEGATIVE

## 2012-09-17 NOTE — Telephone Encounter (Signed)
  Follow up Call-  Call back number 09/14/2012  Post procedure Call Back phone  # [phone]  Permission to leave phone message No     Patient questions:  Do you have a fever, pain , or abdominal swelling? no Pain Score  0 *  Have you tolerated food without any problems? yes  Have you been able to return to your normal activities? yes  Do you have any questions about your discharge instructions: Diet   no Medications  no Follow up visit  no  Do you have questions or concerns about your Care? no  Actions: * If pain score is 4 or above: No action needed, pain <4.

## 2012-09-18 ENCOUNTER — Telehealth: Payer: Self-pay | Admitting: *Deleted

## 2012-09-18 NOTE — Telephone Encounter (Signed)
Called pt to schedule a f/u appt, but one has been made for 10/05/12.

## 2012-09-18 NOTE — Telephone Encounter (Signed)
Message copied by Florene Glen on Tue Sep 18, 2012  9:27 AM ------      Message from: Mardella Layman      Created: Tue Sep 18, 2012  9:24 AM       Needs ov      ----- Message -----         From: Linna Hoff, RN         Sent: 09/17/2012   4:47 PM           To: Mardella Layman, MD            Please advise on recommendations from EGD on 09/14/12; OV? Thanks.       ------

## 2012-10-05 ENCOUNTER — Encounter: Payer: Self-pay | Admitting: Gastroenterology

## 2012-10-05 ENCOUNTER — Ambulatory Visit (INDEPENDENT_AMBULATORY_CARE_PROVIDER_SITE_OTHER): Payer: Managed Care, Other (non HMO) | Admitting: Gastroenterology

## 2012-10-05 VITALS — BP 102/76 | HR 80 | Ht 74.25 in | Wt 248.2 lb

## 2012-10-05 DIAGNOSIS — K219 Gastro-esophageal reflux disease without esophagitis: Secondary | ICD-10-CM

## 2012-10-05 DIAGNOSIS — F1010 Alcohol abuse, uncomplicated: Secondary | ICD-10-CM

## 2012-10-05 DIAGNOSIS — R7989 Other specified abnormal findings of blood chemistry: Secondary | ICD-10-CM

## 2012-10-05 MED ORDER — ESOMEPRAZOLE MAGNESIUM 40 MG PO CPDR: 40.0000 mg | DELAYED_RELEASE_CAPSULE | Freq: Every day | ORAL | Status: DC

## 2012-10-05 NOTE — Patient Instructions (Addendum)
  Please take Nexium once daily thirty minutes before breakfast. New prescription sent to your pharmacy.   Please stop aspirin one week before your Liver Biopsy.  You will get a call from [facility] regarding your Liver Biopsy appointment.   Please make a follow up appointment in one month with Dr. Jarold Motto. ________________________________________________________________________________________________________________________________________________________________________________________________                                               We are excited to introduce MyChart, a new best-in-class service that provides you online access to important information in your electronic medical record. We want to make it easier for you to view your health information - all in one secure location - when and where you need it. We expect MyChart will enhance the quality of care and service we provide.  When you register for MyChart, you can:    View your test results.    Request appointments and receive appointment reminders via email.    Request medication renewals.    View your medical history, allergies, medications and immunizations.    Communicate with your physician's office through a password-protected site.    Conveniently print information such as your medication lists.  To find out if MyChart is right for you, please talk to a member of our clinical staff today. We will gladly answer your questions about this free health and wellness tool.  If you are age 63 or older and want a member of your family to have access to your record, you must provide written consent by completing a proxy form available at our office. Please speak to our clinical staff about guidelines regarding accounts for patients younger than age 81.  As you activate your MyChart account and need any technical assistance, please call the MyChart technical support line at [phone]) or email  your question to [email or web address]. If you email your question(s), please include your name, a return phone number and the best time to reach you.  If you have non-urgent health-related questions, you can send a message to our office through MyChart at [email or web address]. If you have a medical emergency, call 911.  Thank you for using MyChart as your new health and wellness resource!   MyChart licensed from [employer],  [id number]. Patents Pending.

## 2012-10-05 NOTE — Progress Notes (Signed)
This is a 47 year old Caucasian male with a long history of elevated liver enzymes and a previous history of alcohol abuse.  He continues to drink beer, but denies other alcoholic beverages.  Recent ultrasound showed changes consistent with cirrhosis but no evidence of portal hypertension or ascites.  He has severe acid reflux confirmed by recent endoscopy with a large hiatal hernia esophageal erosions.  He currently is asymptomatic in terms of any GI complaints.  Review of his labs shows that he is a heterozygote for hemachromatosis, has negative hepatitis serologies.  Family history is contributory... his mother died of" liver failure".  I suspect she had genetic hemochromatosis.   Current Medications, Allergies, Past Medical History, Past Surgical History, Family History and Social History were reviewed in [employer] record.  ROS: All systems were reviewed and are negative unless otherwise stated in the HPI.          Physical Exam: Healthy-appearing patient without stigmata of chronic liver disease.  Blood pressure 102/86, pulse 80 and regular, and weight 248 with a BMI of 31.66.  There is no hepatosplenomegaly on abdominal exam, ascites, or evidence of peripheral edema.  Mental status is normal.    Assessment and Plan: Erosive gastroesophageal reflux disease doing better on daily PPI therapy.  I've asked him to continue regular Nexium and see me in one month's time.  In the interim, we'll perform ultrasound-guided liver biopsy per radiology.  I suspect this patient has a combination of fatty liver, iron overload, and alcoholic liver disease.  He is negative for hepatitis B and C. on serology testing.  At some point patient also will need colonoscopy screening. No diagnosis found.

## 2012-10-08 ENCOUNTER — Encounter (HOSPITAL_COMMUNITY): Payer: Self-pay | Admitting: Pharmacy Technician

## 2012-10-10 ENCOUNTER — Other Ambulatory Visit: Payer: Self-pay | Admitting: Radiology

## 2012-10-11 ENCOUNTER — Other Ambulatory Visit: Payer: Self-pay | Admitting: Radiology

## 2012-10-15 ENCOUNTER — Encounter (HOSPITAL_COMMUNITY): Payer: Self-pay

## 2012-10-15 ENCOUNTER — Ambulatory Visit (HOSPITAL_COMMUNITY)
Admission: RE | Admit: 2012-10-15 | Discharge: 2012-10-15 | Disposition: A | Discharge status: Home / Self Care | Payer: Managed Care, Other (non HMO) | Source: Ambulatory Visit | Attending: Gastroenterology | Admitting: Gastroenterology

## 2012-10-15 DIAGNOSIS — K219 Gastro-esophageal reflux disease without esophagitis: Secondary | ICD-10-CM

## 2012-10-15 DIAGNOSIS — R748 Abnormal levels of other serum enzymes: Secondary | ICD-10-CM | POA: Insufficient documentation

## 2012-10-15 DIAGNOSIS — F1010 Alcohol abuse, uncomplicated: Secondary | ICD-10-CM

## 2012-10-15 DIAGNOSIS — K7689 Other specified diseases of liver: Secondary | ICD-10-CM | POA: Insufficient documentation

## 2012-10-15 HISTORY — DX: Personal history of other diseases of the digestive system: Z87.19

## 2012-10-15 LAB — APTT: aPTT: 28 seconds (ref 24–37)

## 2012-10-15 LAB — CBC
HCT: 43.3 % (ref 39.0–52.0)
Hemoglobin: 15.3 g/dL (ref 13.0–17.0)
MCH: 32.3 pg (ref 26.0–34.0)
RBC: 4.73 MIL/uL (ref 4.22–5.81)

## 2012-10-15 LAB — PROTIME-INR
INR: 0.93 (ref 0.00–1.49)
Prothrombin Time: 12.4 seconds (ref 11.6–15.2)

## 2012-10-15 MED ORDER — MIDAZOLAM HCL 2 MG/2ML IJ SOLN
INTRAMUSCULAR | Status: AC | PRN
  Administered 2012-10-15: 1 mg via INTRAVENOUS
  Administered 2012-10-15: 2 mg via INTRAVENOUS

## 2012-10-15 MED ORDER — MIDAZOLAM HCL 2 MG/2ML IJ SOLN
INTRAMUSCULAR | Status: AC
  Filled 2012-10-15: qty 6

## 2012-10-15 MED ORDER — HYDROCODONE-ACETAMINOPHEN 5-325 MG PO TABS
1.0000 | ORAL_TABLET | ORAL | Status: DC | PRN
  Filled 2012-10-15: qty 2

## 2012-10-15 MED ORDER — SODIUM CHLORIDE 0.9 % IV SOLN
Freq: Once | INTRAVENOUS | Status: AC
  Administered 2012-10-15: 08:00:00 via INTRAVENOUS

## 2012-10-15 MED ORDER — FENTANYL CITRATE 0.05 MG/ML IJ SOLN
INTRAMUSCULAR | Status: AC
  Filled 2012-10-15: qty 6

## 2012-10-15 MED ORDER — FENTANYL CITRATE 0.05 MG/ML IJ SOLN
INTRAMUSCULAR | Status: AC | PRN
  Administered 2012-10-15: 50 ug via INTRAVENOUS
  Administered 2012-10-15: 100 ug via INTRAVENOUS
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
## 2012-10-15 NOTE — Procedures (Signed)
Interventional Radiology Procedure Note  Procedure: US guided random core biopsy of liver Complications: None Recommendations: - Bedrest x 4 hrs - Clears first 2 hrs, if feeling well then ADT - Hydrocodone PRN pain  Signed,  Sterling Big, MD Vascular & Interventional Radiologist [facility]

## 2012-10-15 NOTE — H&P (Signed)
Agree with PA note.  Will proceed with random liver biopsy.  Signed,  Sterling Big, MD Vascular & Interventional Radiologist [facility]

## 2012-10-23 ENCOUNTER — Telehealth: Payer: Self-pay | Admitting: Gastroenterology

## 2012-10-23 NOTE — Telephone Encounter (Signed)
Dr Jarold Motto, please interpret the Liver Bx results for pt. Thanks.

## 2012-10-23 NOTE — Telephone Encounter (Signed)
Informed pt of bx results and Dr Norval Gable instructions for weight loss. Pt will f/u on 11/08/12 and he voiced understanding.

## 2012-10-23 NOTE — Telephone Encounter (Signed)
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

## 2012-11-08 ENCOUNTER — Ambulatory Visit (INDEPENDENT_AMBULATORY_CARE_PROVIDER_SITE_OTHER): Payer: Managed Care, Other (non HMO) | Admitting: Gastroenterology

## 2012-11-08 ENCOUNTER — Encounter: Payer: Self-pay | Admitting: Gastroenterology

## 2012-11-08 VITALS — BP 116/90 | HR 72 | Ht 74.25 in | Wt 249.0 lb

## 2012-11-08 DIAGNOSIS — K7581 Nonalcoholic steatohepatitis (NASH): Secondary | ICD-10-CM

## 2012-11-08 DIAGNOSIS — K219 Gastro-esophageal reflux disease without esophagitis: Secondary | ICD-10-CM

## 2012-11-08 DIAGNOSIS — K7689 Other specified diseases of liver: Secondary | ICD-10-CM

## 2012-11-08 MED ORDER — PANTOPRAZOLE SODIUM 40 MG PO TBEC: 40.0000 mg | DELAYED_RELEASE_TABLET | Freq: Every day | ORAL | Status: DC

## 2012-11-08 NOTE — Progress Notes (Signed)
This is a 47 year old Caucasian male who underwent recent percutaneous liver biopsy because of elevated liver transaminases.  The liver biopsy showed definite Nash syndrome, score 7 of 8 with some "fibrous portal expansion and fibrous septae with sinusoidal fibrosis.",score 2/4.  The patient remains asymptomatic without right upper quadrant pain or any systemic or specific hepatobiliary complaints.  He gained 30 pounds in weight over the last several years because of problems with rotator cuff surgery on his right shoulder, remains overweight and uses alcohol socially.  He is a heterozygote for hemachromatosis.  Workup for other causes of liver disease had been negative including hepatitis serologies.,  He does have a known hiatal hernia and chronic GERD controlled with PPI therapy.  We have given consideration of possible fundoplication, but he seems to be doing well at this time on PPI meds.  Current Medications, Allergies, Past Medical History, Past Surgical History, Family History and Social History were reviewed in [employer] record.  ROS: All systems were reviewed and are negative unless otherwise stated in the HPI.          Physical Exam:116/90, pulse 72 and regular weight 249 with a BMI of 31.75.  I cannot appreciate stigmata of chronic liver disease.  There is no hepatosplenomegaly, abdominal masses or tenderness.  Bowel sounds are normal.  There is no peripheral edema, swollen joints, and mental status is normal.    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  I have referred him to the [facility] for second opinion and consideration of placement in a clinical research trial for treatment of Nash syndrome.  Patient is aware of the benefit of weight loss in this syndrome.  Continue slow gradual weight loss programs and to keep alcohol use at a minimum.  He asked to be switched  from Nexium 40 mg a day 2 Protonix 40 mg a day for cost savings.  This patient has chronic well-controlled GERD.  Endoscopy this spring showed no evidence of varices. Please copy these notes in previous notes to his referral clinic physicians. No diagnosis found.

## 2012-11-08 NOTE — Addendum Note (Signed)
Addended by: Ok Anis A on: 11/08/2012 09:37 AM   Modules accepted: Orders

## 2012-11-08 NOTE — Patient Instructions (Addendum)
We have sent the following medications to your pharmacy for you to pick up at your convenience: Stop Nexium and start Protonix Protonix 40 mg, please take one tablet by mouth once daily.   You will be referred to [facility]. They will call you with your appointment information. There number is  [phone] Address: [address] [place] [address]

## 2013-02-01 ENCOUNTER — Other Ambulatory Visit: Payer: Self-pay | Admitting: Gastroenterology

## 2013-03-31 ENCOUNTER — Other Ambulatory Visit: Payer: Self-pay | Admitting: Gastroenterology

## 2013-05-09 ENCOUNTER — Other Ambulatory Visit: Payer: Self-pay

## 2013-05-21 ENCOUNTER — Encounter: Payer: Self-pay | Admitting: Gastroenterology

## 2013-05-22 MED ORDER — PANTOPRAZOLE SODIUM 40 MG PO TBEC: 40.0000 mg | DELAYED_RELEASE_TABLET | Freq: Every day | ORAL | Status: DC

## 2014-04-18 ENCOUNTER — Other Ambulatory Visit: Payer: Self-pay

## 2014-06-14 ENCOUNTER — Emergency Department (HOSPITAL_COMMUNITY): Payer: PRIVATE HEALTH INSURANCE

## 2014-06-14 ENCOUNTER — Emergency Department (HOSPITAL_COMMUNITY)
Admission: EM | Admit: 2014-06-14 | Discharge: 2014-06-14 | Disposition: A | Discharge status: Home / Self Care | Payer: PRIVATE HEALTH INSURANCE | Attending: Emergency Medicine | Admitting: Emergency Medicine

## 2014-06-14 ENCOUNTER — Encounter (HOSPITAL_COMMUNITY): Payer: Self-pay | Admitting: Physical Medicine and Rehabilitation

## 2014-06-14 DIAGNOSIS — Z79899 Other long term (current) drug therapy: Secondary | ICD-10-CM | POA: Diagnosis not present

## 2014-06-14 DIAGNOSIS — Z9849 Cataract extraction status, unspecified eye: Secondary | ICD-10-CM | POA: Insufficient documentation

## 2014-06-14 DIAGNOSIS — R531 Weakness: Secondary | ICD-10-CM | POA: Insufficient documentation

## 2014-06-14 DIAGNOSIS — Z7982 Long term (current) use of aspirin: Secondary | ICD-10-CM | POA: Diagnosis not present

## 2014-06-14 DIAGNOSIS — G40909 Epilepsy, unspecified, not intractable, without status epilepticus: Secondary | ICD-10-CM | POA: Insufficient documentation

## 2014-06-14 DIAGNOSIS — K219 Gastro-esophageal reflux disease without esophagitis: Secondary | ICD-10-CM | POA: Diagnosis not present

## 2014-06-14 DIAGNOSIS — Z880 Allergy status to penicillin: Secondary | ICD-10-CM | POA: Diagnosis not present

## 2014-06-14 DIAGNOSIS — Z8669 Personal history of other diseases of the nervous system and sense organs: Secondary | ICD-10-CM | POA: Insufficient documentation

## 2014-06-14 DIAGNOSIS — Z87891 Personal history of nicotine dependence: Secondary | ICD-10-CM | POA: Insufficient documentation

## 2014-06-14 DIAGNOSIS — R42 Dizziness and giddiness: Secondary | ICD-10-CM | POA: Diagnosis present

## 2014-06-14 DIAGNOSIS — R55 Syncope and collapse: Secondary | ICD-10-CM | POA: Diagnosis not present

## 2014-06-14 LAB — I-STAT CHEM 8, ED
BUN: 13 mg/dL (ref 6–23)
CALCIUM ION: 1.09 mmol/L — AB (ref 1.12–1.23)
CREATININE: 1.1 mg/dL (ref 0.50–1.35)
Chloride: 104 mEq/L (ref 96–112)
GLUCOSE: 89 mg/dL (ref 70–99)
HCT: 48 % (ref 39.0–52.0)
HEMOGLOBIN: 16.3 g/dL (ref 13.0–17.0)
Potassium: 4.4 mEq/L (ref 3.7–5.3)
SODIUM: 140 meq/L (ref 137–147)
TCO2: 25 mmol/L (ref 0–100)

## 2014-06-14 LAB — I-STAT TROPONIN, ED: Troponin i, poc: 0 ng/mL (ref 0.00–0.08)

## 2014-06-14 NOTE — Discharge Instructions (Signed)
Near-Syncope Near-syncope (commonly known as near fainting) is sudden weakness, dizziness, or feeling like you might pass out. During an episode of near-syncope, you may also develop pale skin, have tunnel vision, or feel sick to your stomach (nauseous). Near-syncope may occur when getting up after sitting or while standing for a long time. It is caused by a sudden decrease in blood flow to the brain. This decrease can result from various causes or triggers, most of which are not serious. However, because near-syncope can sometimes be a sign of something serious, a medical evaluation is required. The specific cause is often not determined. HOME CARE INSTRUCTIONS  Monitor your condition for any changes. The following actions may help to alleviate any discomfort you are experiencing:  Have someone stay with you until you feel stable.  Lie down right away and prop your feet up if you start feeling like you might faint. Breathe deeply and steadily. Wait until all the symptoms have passed. Most of these episodes last only a few minutes. You may feel tired for several hours.   Drink enough fluids to keep your urine clear or pale yellow.   If you are taking blood pressure or heart medicine, get up slowly when seated or lying down. Take several minutes to sit and then stand. This can reduce dizziness.  Follow up with your health care provider as directed. SEEK IMMEDIATE MEDICAL CARE IF:   You have a severe headache.   You have unusual pain in the chest, abdomen, or back.   You are bleeding from the mouth or rectum, or you have black or tarry stool.   You have an irregular or very fast heartbeat.   You have repeated fainting or have seizure-like jerking during an episode.   You faint when sitting or lying down.   You have confusion.   You have difficulty walking.   You have severe weakness.   You have vision problems.  MAKE SURE YOU:   Understand these instructions.  Will  watch your condition.  Will get help right away if you are not doing well or get worse. Document Released: 06/20/2005 Document Revised: 06/25/2013 Document Reviewed: 11/23/2012 ExitCare Patient Information 2015 [employer]. This information is not intended to replace advice given to you by your health care provider. Make sure you discuss any questions you have with your health care provider.  
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
## 2014-06-14 NOTE — ED Notes (Addendum)
Pt presents to department via [facility] from home for generalized weakness and dizziness, onset this afternoon while walking with wife, states sudden onset of symptoms. History of vertigo and traumatic brain injury. Pt is alert and oriented x4. Denies LOC. CBG 79. 18g LAC.

## 2015-07-06 ENCOUNTER — Encounter (HOSPITAL_COMMUNITY): Payer: Self-pay

## 2015-07-06 ENCOUNTER — Emergency Department (INDEPENDENT_AMBULATORY_CARE_PROVIDER_SITE_OTHER)
Admission: EM | Admit: 2015-07-06 | Discharge: 2015-07-06 | Disposition: A | Discharge status: Home / Self Care | Payer: PRIVATE HEALTH INSURANCE | Source: Home / Self Care

## 2015-07-06 DIAGNOSIS — M545 Low back pain, unspecified: Secondary | ICD-10-CM

## 2015-07-06 LAB — POCT URINALYSIS DIP (DEVICE)
Bilirubin Urine: NEGATIVE
Glucose, UA: NEGATIVE mg/dL
Ketones, ur: NEGATIVE mg/dL
LEUKOCYTES UA: NEGATIVE
NITRITE: NEGATIVE
PH: 6.5 (ref 5.0–8.0)
Protein, ur: NEGATIVE mg/dL
Specific Gravity, Urine: 1.015 (ref 1.005–1.030)
UROBILINOGEN UA: 0.2 mg/dL (ref 0.0–1.0)

## 2015-07-06 MED ORDER — TRAMADOL HCL 50 MG PO TABS: 100.0000 mg | ORAL_TABLET | Freq: Four times a day (QID) | ORAL | Status: DC | PRN

## 2015-07-06 NOTE — Discharge Instructions (Signed)
Back Pain, Adult °Back pain is very common in adults. The cause of back pain is rarely dangerous and the pain often gets better over time. The cause of your back pain may not be known. Some common causes of back pain include: °1. Strain of the muscles or ligaments supporting the spine. °2. Wear and tear (degeneration) of the spinal disks. °3. Arthritis. °4. Direct injury to the back. °For many people, back pain may return. Since back pain is rarely dangerous, most people can learn to manage this condition on their own. °HOME CARE INSTRUCTIONS °Watch your back pain for any changes. The following actions may help to lessen any discomfort you are feeling: °1. Remain active. It is stressful on your back to sit or stand in one place for long periods of time. Do not sit, drive, or stand in one place for more than 30 minutes at a time. Take short walks on even surfaces as soon as you are able. Try to increase the length of time you walk each day. °2. Exercise regularly as directed by your health care provider. Exercise helps your back heal faster. It also helps avoid future injury by keeping your muscles strong and flexible. °3. Do not stay in bed. Resting more than 1-2 days can delay your recovery. °4. Pay attention to your body when you bend and lift. The most comfortable positions are those that put less stress on your recovering back. Always use proper lifting techniques, including: °1. Bending your knees. °2. Keeping the load close to your body. °3. Avoiding twisting. °5. Find a comfortable position to sleep. Use a firm mattress and lie on your side with your knees slightly bent. If you lie on your back, put a pillow under your knees. °6. Avoid feeling anxious or stressed. Stress increases muscle tension and can worsen back pain. It is important to recognize when you are anxious or stressed and learn ways to manage it, such as with exercise. °7. Take medicines only as directed by your health care provider.  Over-the-counter medicines to reduce pain and inflammation are often the most helpful. Your health care provider may prescribe muscle relaxant drugs. These medicines help dull your pain so you can more quickly return to your normal activities and healthy exercise. °8. Apply ice to the injured area: °1. Put ice in a plastic bag. °2. Place a towel between your skin and the bag. °3. Leave the ice on for 20 minutes, 2-3 times a day for the first 2-3 days. After that, ice and heat may be alternated to reduce pain and spasms. °9. Maintain a healthy weight. Excess weight puts extra stress on your back and makes it difficult to maintain good posture. °SEEK MEDICAL CARE IF: °1. You have pain that is not relieved with rest or medicine. °2. You have increasing pain going down into the legs or buttocks. °3. You have pain that does not improve in one week. °4. You have night pain. °5. You lose weight. °6. You have a fever or chills. °SEEK IMMEDIATE MEDICAL CARE IF:  °1. You develop new bowel or bladder control problems. °2. You have unusual weakness or numbness in your arms or legs. °3. You develop nausea or vomiting. °4. You develop abdominal pain. °5. You feel faint. °  °This information is not intended to replace advice given to you by your health care provider. Make sure you discuss any questions you have with your health care provider. °  °Document Released: 06/20/2005 Document Revised: 07/11/2014 Document Reviewed: 10/22/2013 °Elsevier Interactive Patient Education ©2016 [employer]   [employer] ° °Back Exercises °The following exercises strengthen the muscles that help to support the back. They also help to keep the lower back flexible. Doing these exercises can help to prevent back pain or lessen existing pain. °If you have back pain or discomfort, try doing these exercises 2-3 times each day or as told by your health care provider. When the pain goes away, do them once each day, but increase the number of times that you repeat the  steps for each exercise (do more repetitions). If you do not have back pain or discomfort, do these exercises once each day or as told by your health care provider. °EXERCISES °Single Knee to Chest °Repeat these steps 3-5 times for each leg: °5. Lie on your back on a firm bed or the floor with your legs extended. °6. Bring one knee to your chest. Your other leg should stay extended and in contact with the floor. °7. Hold your knee in place by grabbing your knee or thigh. °8. Pull on your knee until you feel a gentle stretch in your lower back. °9. Hold the stretch for 10-30 seconds. °10. Slowly release and straighten your leg. °Pelvic Tilt °Repeat these steps 5-10 times: °10. Lie on your back on a firm bed or the floor with your legs extended. °11. Bend your knees so they are pointing toward the ceiling and your feet are flat on the floor. °12. Tighten your lower abdominal muscles to press your lower back against the floor. This motion will tilt your pelvis so your tailbone points up toward the ceiling instead of pointing to your feet or the floor. °13. With gentle tension and even breathing, hold this position for 5-10 seconds. °Cat-Cow °Repeat these steps until your lower back becomes more flexible: °7. Get into a hands-and-knees position on a firm surface. Keep your hands under your shoulders, and keep your knees under your hips. You may place padding under your knees for comfort. °8. Let your head hang down, and point your tailbone toward the floor so your lower back becomes rounded like the back of a cat. °9. Hold this position for 5 seconds. °10. Slowly lift your head and point your tailbone up toward the ceiling so your back forms a sagging arch like the back of a cow. °11. Hold this position for 5 seconds. °Press-Ups °Repeat these steps 5-10 times: °6. Lie on your abdomen (face-down) on the floor. °7. Place your palms near your head, about shoulder-width apart. °8. While you keep your back as relaxed as  possible and keep your hips on the floor, slowly straighten your arms to raise the top half of your body and lift your shoulders. Do not use your back muscles to raise your upper torso. You may adjust the placement of your hands to make yourself more comfortable. °9. Hold this position for 5 seconds while you keep your back relaxed. °10. Slowly return to lying flat on the floor. °Bridges °Repeat these steps 10 times: °1. Lie on your back on a firm surface. °2. Bend your knees so they are pointing toward the ceiling and your feet are flat on the floor. °3. Tighten your buttocks muscles and lift your buttocks off of the floor until your waist is at almost the same height as your knees. You should feel the muscles working in your buttocks and the back of your thighs. If you do not feel these muscles, slide your feet 1-2 inches farther away from your buttocks. °4. Hold this   position for 3-5 seconds. °5. Slowly lower your hips to the starting position, and allow your buttocks muscles to relax completely. °If this exercise is too easy, try doing it with your arms crossed over your chest. °Abdominal Crunches °Repeat these steps 5-10 times: °1. Lie on your back on a firm bed or the floor with your legs extended. °2. Bend your knees so they are pointing toward the ceiling and your feet are flat on the floor. °3. Cross your arms over your chest. °4. Tip your chin slightly toward your chest without bending your neck. °5. Tighten your abdominal muscles and slowly raise your trunk (torso) high enough to lift your shoulder blades a tiny bit off of the floor. Avoid raising your torso higher than that, because it can put too much stress on your low back and it does not help to strengthen your abdominal muscles. °6. Slowly return to your starting position. °Back Lifts °Repeat these steps 5-10 times: °1. Lie on your abdomen (face-down) with your arms at your sides, and rest your forehead on the floor. °2. Tighten the muscles in your  legs and your buttocks. °3. Slowly lift your chest off of the floor while you keep your hips pressed to the floor. Keep the back of your head in line with the curve in your back. Your eyes should be looking at the floor. °4. Hold this position for 3-5 seconds. °5. Slowly return to your starting position. °SEEK MEDICAL CARE IF: °· Your back pain or discomfort gets much worse when you do an exercise. °· Your back pain or discomfort does not lessen within 2 hours after you exercise. °If you have any of these problems, stop doing these exercises right away. Do not do them again unless your health care provider says that you can. °SEEK IMMEDIATE MEDICAL CARE IF: °· You develop sudden, severe back pain. If this happens, stop doing the exercises right away. Do not do them again unless your health care provider says that you can. °  °This information is not intended to replace advice given to you by your health care provider. Make sure you discuss any questions you have with your health care provider. °  °Document Released: 07/28/2004 Document Revised: 03/11/2015 Document Reviewed: 08/14/2014 °Elsevier Interactive Patient Education ©2016 Else[employer] Inc. ° °
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
## 2015-07-06 NOTE — ED Notes (Signed)
Patient complains of left sided flank pain for the past couple of days with  Today being the worst as far as his pain

## 2017-10-31 ENCOUNTER — Inpatient Hospital Stay (HOSPITAL_COMMUNITY): Payer: PRIVATE HEALTH INSURANCE | Admitting: Anesthesiology

## 2017-10-31 ENCOUNTER — Emergency Department (HOSPITAL_COMMUNITY): Payer: PRIVATE HEALTH INSURANCE

## 2017-10-31 ENCOUNTER — Other Ambulatory Visit: Payer: Self-pay

## 2017-10-31 ENCOUNTER — Encounter (HOSPITAL_COMMUNITY): Payer: Self-pay

## 2017-10-31 ENCOUNTER — Encounter (HOSPITAL_COMMUNITY)
Admission: EM | Disposition: A | Discharge status: Rehabilitation Facility | Payer: Self-pay | Source: Home / Self Care | Attending: Neurology

## 2017-10-31 ENCOUNTER — Inpatient Hospital Stay (HOSPITAL_COMMUNITY)
Admission: EM | Admit: 2017-10-31 | Discharge: 2017-11-03 | DRG: 064 | Disposition: A | Discharge status: Rehabilitation Facility | Payer: PRIVATE HEALTH INSURANCE | Attending: Neurology | Admitting: Neurology

## 2017-10-31 DIAGNOSIS — Z888 Allergy status to other drugs, medicaments and biological substances status: Secondary | ICD-10-CM

## 2017-10-31 DIAGNOSIS — I340 Nonrheumatic mitral (valve) insufficiency: Secondary | ICD-10-CM | POA: Diagnosis not present

## 2017-10-31 DIAGNOSIS — I10 Essential (primary) hypertension: Secondary | ICD-10-CM | POA: Diagnosis present

## 2017-10-31 DIAGNOSIS — R471 Dysarthria and anarthria: Secondary | ICD-10-CM | POA: Diagnosis present

## 2017-10-31 DIAGNOSIS — I6350 Cerebral infarction due to unspecified occlusion or stenosis of unspecified cerebral artery: Secondary | ICD-10-CM | POA: Diagnosis present

## 2017-10-31 DIAGNOSIS — R569 Unspecified convulsions: Secondary | ICD-10-CM | POA: Diagnosis present

## 2017-10-31 DIAGNOSIS — Z9841 Cataract extraction status, right eye: Secondary | ICD-10-CM

## 2017-10-31 DIAGNOSIS — Z6833 Body mass index (BMI) 33.0-33.9, adult: Secondary | ICD-10-CM

## 2017-10-31 DIAGNOSIS — D72829 Elevated white blood cell count, unspecified: Secondary | ICD-10-CM | POA: Diagnosis not present

## 2017-10-31 DIAGNOSIS — Z87891 Personal history of nicotine dependence: Secondary | ICD-10-CM

## 2017-10-31 DIAGNOSIS — K219 Gastro-esophageal reflux disease without esophagitis: Secondary | ICD-10-CM | POA: Diagnosis present

## 2017-10-31 DIAGNOSIS — I469 Cardiac arrest, cause unspecified: Secondary | ICD-10-CM | POA: Diagnosis not present

## 2017-10-31 DIAGNOSIS — I639 Cerebral infarction, unspecified: Principal | ICD-10-CM

## 2017-10-31 DIAGNOSIS — R402142 Coma scale, eyes open, spontaneous, at arrival to emergency department: Secondary | ICD-10-CM | POA: Diagnosis present

## 2017-10-31 DIAGNOSIS — Z87820 Personal history of traumatic brain injury: Secondary | ICD-10-CM | POA: Diagnosis not present

## 2017-10-31 DIAGNOSIS — Z79899 Other long term (current) drug therapy: Secondary | ICD-10-CM

## 2017-10-31 DIAGNOSIS — R112 Nausea with vomiting, unspecified: Secondary | ICD-10-CM | POA: Diagnosis present

## 2017-10-31 DIAGNOSIS — E785 Hyperlipidemia, unspecified: Secondary | ICD-10-CM | POA: Diagnosis present

## 2017-10-31 DIAGNOSIS — R739 Hyperglycemia, unspecified: Secondary | ICD-10-CM | POA: Diagnosis present

## 2017-10-31 DIAGNOSIS — R1310 Dysphagia, unspecified: Secondary | ICD-10-CM | POA: Diagnosis present

## 2017-10-31 DIAGNOSIS — I651 Occlusion and stenosis of basilar artery: Secondary | ICD-10-CM | POA: Diagnosis present

## 2017-10-31 DIAGNOSIS — G8194 Hemiplegia, unspecified affecting left nondominant side: Secondary | ICD-10-CM | POA: Diagnosis not present

## 2017-10-31 DIAGNOSIS — J690 Pneumonitis due to inhalation of food and vomit: Secondary | ICD-10-CM | POA: Diagnosis present

## 2017-10-31 DIAGNOSIS — R29706 NIHSS score 6: Secondary | ICD-10-CM | POA: Diagnosis present

## 2017-10-31 DIAGNOSIS — Z880 Allergy status to penicillin: Secondary | ICD-10-CM

## 2017-10-31 DIAGNOSIS — E669 Obesity, unspecified: Secondary | ICD-10-CM | POA: Diagnosis present

## 2017-10-31 DIAGNOSIS — G8192 Hemiplegia, unspecified affecting left dominant side: Secondary | ICD-10-CM | POA: Diagnosis present

## 2017-10-31 DIAGNOSIS — G931 Anoxic brain damage, not elsewhere classified: Secondary | ICD-10-CM | POA: Diagnosis present

## 2017-10-31 DIAGNOSIS — Z9842 Cataract extraction status, left eye: Secondary | ICD-10-CM

## 2017-10-31 DIAGNOSIS — G479 Sleep disorder, unspecified: Secondary | ICD-10-CM | POA: Diagnosis not present

## 2017-10-31 DIAGNOSIS — R402362 Coma scale, best motor response, obeys commands, at arrival to emergency department: Secondary | ICD-10-CM | POA: Diagnosis present

## 2017-10-31 DIAGNOSIS — I739 Peripheral vascular disease, unspecified: Secondary | ICD-10-CM | POA: Diagnosis present

## 2017-10-31 DIAGNOSIS — G92 Toxic encephalopathy: Secondary | ICD-10-CM | POA: Diagnosis present

## 2017-10-31 DIAGNOSIS — Z885 Allergy status to narcotic agent status: Secondary | ICD-10-CM

## 2017-10-31 DIAGNOSIS — G9389 Other specified disorders of brain: Secondary | ICD-10-CM | POA: Diagnosis present

## 2017-10-31 DIAGNOSIS — I6302 Cerebral infarction due to thrombosis of basilar artery: Secondary | ICD-10-CM | POA: Diagnosis not present

## 2017-10-31 DIAGNOSIS — R402252 Coma scale, best verbal response, oriented, at arrival to emergency department: Secondary | ICD-10-CM | POA: Diagnosis present

## 2017-10-31 DIAGNOSIS — I671 Cerebral aneurysm, nonruptured: Secondary | ICD-10-CM | POA: Diagnosis present

## 2017-10-31 DIAGNOSIS — I69352 Hemiplegia and hemiparesis following cerebral infarction affecting left dominant side: Secondary | ICD-10-CM | POA: Diagnosis not present

## 2017-10-31 DIAGNOSIS — Z91030 Bee allergy status: Secondary | ICD-10-CM | POA: Diagnosis not present

## 2017-10-31 DIAGNOSIS — R7303 Prediabetes: Secondary | ICD-10-CM | POA: Diagnosis not present

## 2017-10-31 HISTORY — PX: IR ANGIO VERTEBRAL SEL VERTEBRAL UNI R MOD SED: IMG5368

## 2017-10-31 HISTORY — PX: IR ANGIO VERTEBRAL SEL SUBCLAVIAN INNOMINATE UNI L MOD SED: IMG5364

## 2017-10-31 HISTORY — PX: IR ANGIO INTRA EXTRACRAN SEL COM CAROTID INNOMINATE BILAT MOD SED: IMG5360

## 2017-10-31 HISTORY — PX: RADIOLOGY WITH ANESTHESIA: SHX6223

## 2017-10-31 LAB — COMPREHENSIVE METABOLIC PANEL
ALT: 74 U/L — AB (ref 17–63)
AST: 74 U/L — AB (ref 15–41)
Albumin: 4 g/dL (ref 3.5–5.0)
Alkaline Phosphatase: 79 U/L (ref 38–126)
Anion gap: 10 (ref 5–15)
BUN: 11 mg/dL (ref 6–20)
CALCIUM: 9 mg/dL (ref 8.9–10.3)
CO2: 25 mmol/L (ref 22–32)
Chloride: 100 mmol/L — ABNORMAL LOW (ref 101–111)
Creatinine, Ser: 1.16 mg/dL (ref 0.61–1.24)
GFR calc Af Amer: >60 mL/min (ref >60)
GFR calc non Af Amer: >60 mL/min (ref >60)
GLUCOSE: 133 mg/dL — AB (ref 65–99)
Potassium: 3.8 mmol/L (ref 3.5–5.1)
SODIUM: 135 mmol/L (ref 135–145)
Total Bilirubin: 0.6 mg/dL (ref 0.3–1.2)
Total Protein: 6.9 g/dL (ref 6.5–8.1)

## 2017-10-31 LAB — DIFFERENTIAL
Basophils Absolute: 0.1 10*3/uL (ref 0.0–0.1)
Basophils Relative: 1 %
Eosinophils Absolute: 0.1 10*3/uL (ref 0.0–0.7)
Eosinophils Relative: 1 %
LYMPHS PCT: 16 %
Lymphs Abs: 1.7 10*3/uL (ref 0.7–4.0)
Monocytes Absolute: 0.8 10*3/uL (ref 0.1–1.0)
Monocytes Relative: 7 %
NEUTROS ABS: 8.1 10*3/uL — AB (ref 1.7–7.7)
Neutrophils Relative %: 75 %

## 2017-10-31 LAB — CBC
HCT: 39.7 % (ref 39.0–52.0)
Hemoglobin: 12.9 g/dL — ABNORMAL LOW (ref 13.0–17.0)
MCH: 26.7 pg (ref 26.0–34.0)
MCHC: 32.5 g/dL (ref 30.0–36.0)
MCV: 82 fL (ref 78.0–100.0)
Platelets: 271 10*3/uL (ref 150–400)
RBC: 4.84 MIL/uL (ref 4.22–5.81)
RDW: 14.7 % (ref 11.5–15.5)
WBC: 10.7 10*3/uL — AB (ref 4.0–10.5)

## 2017-10-31 LAB — I-STAT CHEM 8, ED
BUN: 12 mg/dL (ref 6–20)
CALCIUM ION: 1.16 mmol/L (ref 1.15–1.40)
CHLORIDE: 100 mmol/L — AB (ref 101–111)
Creatinine, Ser: 1.1 mg/dL (ref 0.61–1.24)
Glucose, Bld: 135 mg/dL — ABNORMAL HIGH (ref 65–99)
HEMATOCRIT: 42 % (ref 39.0–52.0)
Hemoglobin: 14.3 g/dL (ref 13.0–17.0)
Potassium: 3.9 mmol/L (ref 3.5–5.1)
SODIUM: 138 mmol/L (ref 135–145)
TCO2: 25 mmol/L (ref 22–32)

## 2017-10-31 LAB — APTT: aPTT: 25 seconds (ref 24–36)

## 2017-10-31 LAB — PROTIME-INR
INR: 1.02
Prothrombin Time: 13.3 seconds (ref 11.4–15.2)

## 2017-10-31 LAB — I-STAT TROPONIN, ED: Troponin i, poc: 0 ng/mL (ref 0.00–0.08)

## 2017-10-31 LAB — ETHANOL: Alcohol, Ethyl (B): <10 mg/dL (ref <10)

## 2017-10-31 SURGERY — RADIOLOGY WITH ANESTHESIA
Anesthesia: Monitor Anesthesia Care

## 2017-10-31 MED ORDER — TIROFIBAN HCL IN NACL 5-0.9 MG/100ML-% IV SOLN
INTRAVENOUS | Status: AC
  Filled 2017-10-31: qty 100

## 2017-10-31 MED ORDER — OXYCODONE HCL 5 MG PO TABS: 5.0000 mg | ORAL_TABLET | Freq: Once | ORAL | Status: AC | PRN

## 2017-10-31 MED ORDER — ASPIRIN 325 MG PO TABS
ORAL_TABLET | ORAL | Status: AC
  Filled 2017-10-31: qty 1

## 2017-10-31 MED ORDER — LIDOCAINE HCL 1 % IJ SOLN
INTRAMUSCULAR | Status: DC | PRN
  Administered 2017-10-31: 10 mL

## 2017-10-31 MED ORDER — NITROGLYCERIN 1 MG/10 ML FOR IR/CATH LAB
INTRA_ARTERIAL | Status: AC
  Filled 2017-10-31: qty 10

## 2017-10-31 MED ORDER — LACTATED RINGERS IV SOLN
INTRAVENOUS | Status: DC | PRN
  Administered 2017-10-31: 22:00:00 via INTRAVENOUS

## 2017-10-31 MED ORDER — ACETAMINOPHEN 160 MG/5ML PO SOLN: 650.0000 mg | ORAL | Status: DC | PRN

## 2017-10-31 MED ORDER — IOPAMIDOL (ISOVUE-300) INJECTION 61%
INTRAVENOUS | Status: AC
  Administered 2017-10-31: 125 mL
  Filled 2017-10-31: qty 300

## 2017-10-31 MED ORDER — IOPAMIDOL (ISOVUE-370) INJECTION 76%
100.0000 mL | Freq: Once | INTRAVENOUS | Status: AC | PRN
  Administered 2017-10-31: 100 mL via INTRAVENOUS

## 2017-10-31 MED ORDER — LIDOCAINE HCL 1 % IJ SOLN
INTRAMUSCULAR | Status: AC
Start: 2017-10-31 — End: 2017-11-01
  Filled 2017-10-31: qty 20

## 2017-10-31 MED ORDER — ONDANSETRON HCL 4 MG/2ML IJ SOLN
INTRAMUSCULAR | Status: DC | PRN
  Administered 2017-10-31: 4 mg via INTRAVENOUS

## 2017-10-31 MED ORDER — CLOPIDOGREL BISULFATE 300 MG PO TABS
ORAL_TABLET | ORAL | Status: AC
  Filled 2017-10-31: qty 1

## 2017-10-31 MED ORDER — IOPAMIDOL (ISOVUE-370) INJECTION 76%
INTRAVENOUS | Status: AC
  Filled 2017-10-31: qty 100

## 2017-10-31 MED ORDER — CLOPIDOGREL BISULFATE 75 MG PO TABS: 300.0000 mg | ORAL_TABLET | Freq: Once | ORAL | Status: DC

## 2017-10-31 MED ORDER — ACETAMINOPHEN 650 MG RE SUPP: 650.0000 mg | RECTAL | Status: DC | PRN

## 2017-10-31 MED ORDER — ASPIRIN 325 MG PO TABS
325.0000 mg | ORAL_TABLET | Freq: Every day | ORAL | Status: DC
  Administered 2017-11-01 – 2017-11-03 (×3): 325 mg via ORAL
  Filled 2017-10-31 (×3): qty 1

## 2017-10-31 MED ORDER — VANCOMYCIN HCL IN DEXTROSE 1-5 GM/200ML-% IV SOLN
INTRAVENOUS | Status: AC
  Filled 2017-10-31: qty 200

## 2017-10-31 MED ORDER — OXYCODONE HCL 5 MG/5ML PO SOLN
ORAL | Status: AC
  Filled 2017-10-31: qty 5

## 2017-10-31 MED ORDER — SENNOSIDES-DOCUSATE SODIUM 8.6-50 MG PO TABS: 1.0000 | ORAL_TABLET | Freq: Every evening | ORAL | Status: DC | PRN

## 2017-10-31 MED ORDER — FENTANYL CITRATE (PF) 100 MCG/2ML IJ SOLN
INTRAMUSCULAR | Status: DC | PRN
  Administered 2017-10-31: 25 ug via INTRAVENOUS

## 2017-10-31 MED ORDER — SODIUM CHLORIDE 0.9 % IV SOLN
INTRAVENOUS | Status: DC
  Administered 2017-11-01 (×2): via INTRAVENOUS

## 2017-10-31 MED ORDER — ACETAMINOPHEN 325 MG PO TABS
650.0000 mg | ORAL_TABLET | ORAL | Status: DC | PRN
  Administered 2017-11-01 – 2017-11-03 (×2): 650 mg via ORAL
  Filled 2017-10-31 (×2): qty 2

## 2017-10-31 MED ORDER — ONDANSETRON HCL 4 MG/2ML IJ SOLN
INTRAMUSCULAR | Status: AC
  Filled 2017-10-31: qty 2

## 2017-10-31 MED ORDER — FENTANYL CITRATE (PF) 100 MCG/2ML IJ SOLN
25.0000 ug | INTRAMUSCULAR | Status: DC | PRN
  Administered 2017-11-01 (×2): 50 ug via INTRAVENOUS
  Filled 2017-10-31 (×2): qty 2

## 2017-10-31 MED ORDER — FENTANYL CITRATE (PF) 100 MCG/2ML IJ SOLN
INTRAMUSCULAR | Status: AC
  Filled 2017-10-31: qty 2

## 2017-10-31 MED ORDER — EPTIFIBATIDE 20 MG/10ML IV SOLN
INTRAVENOUS | Status: AC
  Filled 2017-10-31: qty 10

## 2017-10-31 MED ORDER — TICAGRELOR 90 MG PO TABS
ORAL_TABLET | ORAL | Status: AC
  Filled 2017-10-31: qty 2

## 2017-10-31 MED ORDER — STROKE: EARLY STAGES OF RECOVERY BOOK
Freq: Once | Status: DC
  Filled 2017-10-31: qty 1

## 2017-10-31 MED ORDER — ONDANSETRON HCL 4 MG/2ML IJ SOLN
4.0000 mg | Freq: Four times a day (QID) | INTRAMUSCULAR | Status: AC | PRN
  Administered 2017-11-01: 4 mg via INTRAVENOUS
  Filled 2017-10-31: qty 2

## 2017-10-31 MED ORDER — OXYCODONE HCL 5 MG/5ML PO SOLN
5.0000 mg | Freq: Once | ORAL | Status: AC | PRN
  Administered 2017-10-31: 5 mg via ORAL

## 2017-10-31 NOTE — Anesthesia Preprocedure Evaluation (Signed)
Anesthesia Evaluation  Patient identified by MRN, date of birth, ID band Patient awake  General Assessment Comment:Slurring speech  Reviewed: Allergy & Precautions, H&P , NPO status , Patient's Chart, lab work & pertinent test results  Airway Mallampati: II   Neck ROM: full    Dental   Pulmonary former smoker,    breath sounds clear to auscultation       Cardiovascular negative cardio ROS   Rhythm:regular Rate:Normal     Neuro/Psych Seizures -,  CVA    GI/Hepatic hiatal hernia, GERD  ,  Endo/Other    Renal/GU      Musculoskeletal   Abdominal   Peds  Hematology   Anesthesia Other Findings   Reproductive/Obstetrics                             Anesthesia Physical Anesthesia Plan  ASA: III and emergent  Anesthesia Plan: MAC   Post-op Pain Management:    Induction: Intravenous  PONV Risk Score and Plan: 1 and Midazolam and Ondansetron  Airway Management Planned: Simple Face Mask  Additional Equipment:   Intra-op Plan:   Post-operative Plan:   Informed Consent: I have reviewed the patients History and Physical, chart, labs and discussed the procedure including the risks, benefits and alternatives for the proposed anesthesia with the patient or authorized representative who has indicated his/her understanding and acceptance.     Plan Discussed with: CRNA, Anesthesiologist and Surgeon  Anesthesia Plan Comments:         Anesthesia Quick Evaluation

## 2017-10-31 NOTE — ED Notes (Signed)
ED Provider at bedside. 

## 2017-10-31 NOTE — Transfer of Care (Signed)
Immediate Anesthesia Transfer of Care Note  Patient: Justin Cooke  Procedure(s) Performed: RADIOLOGY WITH ANESTHESIA (N/A )  Patient Location: PACU  Anesthesia Type:MAC  Level of Consciousness: awake  Airway & Oxygen Therapy: Patient Spontanous Breathing  Post-op Assessment: Report given to RN and Post -op Vital signs reviewed and stable  Post vital signs: Reviewed and stable  Last Vitals:  Vitals Value Taken Time  BP 137/98 10/31/2017 10:52 PM  Temp    Pulse 78 10/31/2017 10:55 PM  Resp 19 10/31/2017 10:55 PM  SpO2 97 % 10/31/2017 10:55 PM  Vitals shown include unvalidated device data.  Last Pain:  Vitals:   10/31/17 2002  TempSrc:   PainSc: 4          Complications: No apparent anesthesia complications

## 2017-10-31 NOTE — ED Notes (Signed)
Patient transported to CT 

## 2017-10-31 NOTE — Procedures (Signed)
S/P 4 vessel cerebral arteriogram RT CFA approach. Findings. 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 3.Mild dolichoectasia of the supraclinoid  ICAs.

## 2017-10-31 NOTE — ED Provider Notes (Signed)
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 HEAD WITHOUT CONTRAST CT ANGIOGRAPHY HEAD AND NECK CT PERFUSION BRAIN TECHNIQUE: CT of the head from skull base to vertex was acquired without intravenous contrast with coronal and sagittal reconstructions. Multidetector CT imaging of the head and neck was performed using the standard protocol during bolus administration of intravenous contrast. Multiplanar CT image reconstructions and MIPs were obtained to evaluate the vascular anatomy. Carotid stenosis measurements (when applicable) are obtained utilizing NASCET criteria, using the distal internal carotid diameter as the denominator. Multiphase CT imaging of the brain was performed following IV bolus contrast injection. Subsequent parametric perfusion maps were calculated using RAPID software. CONTRAST:  ISOVUE-370 IOPAMIDOL (ISOVUE-370) INJECTION 76% COMPARISON:  06/14/2014 and 03/17/2009 CT of the head. FINDINGS: CT HEAD FINDINGS Brain: Stable chronic encephalomalacia in the left frontoparietal junction and within the right greater than left frontal orbital cortices. No large acute stroke identified. No hemorrhage or mass effect. No large supratentorial vascular territory acute  stroke. Mild chronic microvascular ischemic changes and parenchymal volume loss of the brain are stable. Vascular: Right eccentric high attenuation within the wall of aneurysmal basilar artery. Skull: Normal. Negative for fracture or focal lesion. Sinuses/Orbits: Extensive paranasal sinus disease with diffuse mucosal thickening and partial opacification of maxillary as well as ethmoid air cells. Normal aeration of mastoid air cells. Bilateral intra-ocular lens replacement. Other: None. ASPECTS (Alberta Stroke Program Early CT Score) - Ganglionic level infarction (caudate, lentiform nuclei, internal capsule, insula, M1-M3 cortex): 7 - Supraganglionic infarction (M4-M6 cortex): 3 Total score (0-10 with 10 being normal): 10 Review of the MIP images confirms the above findings CTA NECK FINDINGS Aortic arch: Standard branching. Imaged portion shows no evidence of aneurysm or dissection. No significant stenosis of the major arch vessel origins. Right carotid system: No evidence of dissection, stenosis (50% or greater) or occlusion. Left carotid system: No evidence of dissection, stenosis (50% or greater) or occlusion. Vertebral arteries: Codominant. No evidence of dissection, stenosis (50% or greater) or occlusion. Skeleton: Mild cervical spondylosis with multilevel disc and facet degenerative changes. No high-grade bony canal stenosis. Other neck: Negative. Upper chest: Negative. Review of the MIP images confirms the above findings CTA HEAD FINDINGS Anterior circulation: No significant stenosis, proximal occlusion, aneurysm, or vascular malformation. 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 Right eccentric high attenuation within the right aspect of the mid basilar artery does not opacify with contrast and there is no dissection flap compatible with intramural hematoma. Bilateral vertebral arteries, bilateral PICA, bilateral SCA, and bilateral PCA are patent without significant  stenosis, aneurysm, or occlusion. AICA are not identified, possibly hypoplastic or absent. Venous sinuses: As permitted by contrast timing, patent. Anatomic variants: Patent anterior communicating artery. No posterior communicating artery identified, likely hypoplastic or absent. Delayed phase: No abnormal intracranial enhancement. Review of the MIP images confirms the above findings CT Brain Perfusion Findings: CBF (<30%) Volume: 4mL Perfusion (Tmax>6.0s) volume: 88mL Mismatch Volume: 84mL Infarction Location:Core infarct, CBF (<30%), in the left occipital lobe PCA distribution. Ischemic penumbra, perfusion (Tmax>6.0s), throughout the cerebellum, left-greater-than-right occipital lobes, and left posterior thalamus. Additionally there is increased T-max >4.0s throughout the entire posterior circulation. IMPRESSION:  1. No acute abnormality on noncontrast CT of head.  Aspects is 10. 2. 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 3. Eccentric hyperdense wall thickening and luminal narrowing within the proximal to mid basilar artery that does not enhance on the angiogram. Findings probably represent an intramural hematoma or possibly dissection although no dissection flap is identified. 4. Otherwise no large vessel occlusion, high-grade stenosis, or aneurysm of the anterior posterior intracranial circulation. 5. No significant stenosis of the carotid or vertebral arteries by NASCET criteria. 6. Perfusion anomaly throughout the posterior circulation with mildly increased T-max. RAPID calculated core infarct of 4 cc in the left occipital lobe and RAPID calculated ischemic penumbra of 84 cc in the left-greater-than-right PCA distributions as well as cerebellum. 7. Chronic left parietal and bilateral frontal orbital areas of encephalomalacia. Stable background of chronic microvascular ischemic changes of white matter and parenchymal volume loss of the brain. 8. Extensive paranasal sinus disease.  These results were called by telephone at the time of interpretation on 10/31/2017 at 8:21 pm to Dr. Wilford Corner, who verbally acknowledged these results. Electronically Signed   By: Mitzi Hansen M.D.   On: 10/31/2017 20:47    ____________________________________________   PROCEDURES  Procedure(s) performed:   Procedures  CRITICAL CARE Performed by: Marily Memos Total critical care time: 35 minutes Critical care time was exclusive of separately billable procedures and treating other patients. Critical care was necessary to treat or prevent imminent or life-threatening deterioration. Critical care was time spent personally by me on the following activities: development of treatment plan with patient and/or surrogate as well as nursing, discussions with consultants, evaluation of patient's response to treatment, examination of patient, obtaining history from patient or surrogate, ordering and performing treatments and interventions, ordering and review of laboratory studies, ordering and review of radiographic studies, pulse oximetry and re-evaluation of patient's condition.  ____________________________________________   INITIAL IMPRESSION / ASSESSMENT AND PLAN / ED COURSE  Initially no code stroke called however subsequently found out that he had diplopia which was new.  This does meet criteria for large vessel occlusions ago stroke activated at that time.  CTA head and neck and perfusion study ordered.  Regular work-up ordered.  Found to have large aneurysm and dissection accounting for stroke. Dr. Wilford Corner will d/w interventionalist.    Pertinent labs & imaging results that were available during my care of the patient were reviewed by me and considered in my medical decision making (see chart for details).  ____________________________________________  FINAL CLINICAL IMPRESSION(S) / ED DIAGNOSES  Final diagnoses:  Stroke (cerebrum) (HCC)  Stroke (HCC)  Cerebrovascular  accident (CVA), unspecified mechanism (HCC)     MEDICATIONS GIVEN DURING THIS VISIT:  Medications  iopamidol (ISOVUE-370) 76 % injection (has no administration in time range)   stroke: mapping our early stages of recovery book (has no administration in time range)  0.9 %  sodium chloride infusion (has no administration in time range)  acetaminophen (TYLENOL) tablet 650 mg (has no administration in time range)    Or  acetaminophen (TYLENOL) solution 650 mg (has no administration in time range)    Or  acetaminophen (TYLENOL) suppository 650 mg (has no administration in time range)  senna-docusate (Senokot-S) tablet 1 tablet (has no administration in time range)  lidocaine (XYLOCAINE) 1 % (with pres) injection (has no administration in time range)  lidocaine (XYLOCAINE) 1 % (with pres) injection (10 mLs Infiltration Given 10/31/17 2140)  ondansetron (ZOFRAN) injection 4 mg (has no administration in time  range)  fentaNYL (SUBLIMAZE) injection 25-50 mcg (has no administration in time range)  clopidogrel (PLAVIX) tablet 300 mg (has no administration in time range)  aspirin tablet 325 mg (has no administration in time range)  fentaNYL (SUBLIMAZE) 100 MCG/2ML injection (has no administration in time range)  oxyCODONE (ROXICODONE) 5 MG/5ML solution (has no administration in time range)  iopamidol (ISOVUE-370) 76 % injection 100 mL (100 mLs Intravenous Contrast Given 10/31/17 2012)  iopamidol (ISOVUE-300) 61 % injection (125 mLs  Contrast Given 10/31/17 2226)  oxyCODONE (Oxy IR/ROXICODONE) immediate release tablet 5 mg ( Oral See Alternative 10/31/17 2335)    Or  oxyCODONE (ROXICODONE) 5 MG/5ML solution 5 mg (5 mg Oral Given 10/31/17 2335)     NEW OUTPATIENT MEDICATIONS STARTED DURING THIS VISIT:  New Prescriptions   No medications on file    Note:  This note was prepared with assistance of Dragon voice recognition software. Occasional wrong-word or sound-a-like substitutions may have occurred  due to the inherent limitations of voice recognition software.   Klaudia Beirne, Barbara Cower, MD 11/01/17 0001

## 2017-10-31 NOTE — ED Notes (Signed)
Pt transferred to IR staff. Report given to IR by Dr Wilford Corner.
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
## 2017-11-01 ENCOUNTER — Inpatient Hospital Stay (HOSPITAL_COMMUNITY): Payer: PRIVATE HEALTH INSURANCE

## 2017-11-01 ENCOUNTER — Other Ambulatory Visit: Payer: Self-pay

## 2017-11-01 ENCOUNTER — Encounter (HOSPITAL_COMMUNITY): Payer: Self-pay

## 2017-11-01 DIAGNOSIS — I651 Occlusion and stenosis of basilar artery: Secondary | ICD-10-CM

## 2017-11-01 LAB — HIV ANTIBODY (ROUTINE TESTING W REFLEX): HIV Screen 4th Generation wRfx: NONREACTIVE

## 2017-11-01 LAB — LIPID PANEL
CHOL/HDL RATIO: 5 ratio
Cholesterol: 218 mg/dL — ABNORMAL HIGH (ref 0–200)
HDL: 44 mg/dL (ref >40)
LDL CALC: 157 mg/dL — AB (ref 0–99)
Triglycerides: 86 mg/dL (ref <150)
VLDL: 17 mg/dL (ref 0–40)

## 2017-11-01 LAB — HEMOGLOBIN A1C
Hgb A1c MFr Bld: 6.6 % — ABNORMAL HIGH (ref 4.8–5.6)
Mean Plasma Glucose: 142.72 mg/dL

## 2017-11-01 LAB — URINALYSIS, ROUTINE W REFLEX MICROSCOPIC
Bacteria, UA: NONE SEEN
Bilirubin Urine: NEGATIVE
Glucose, UA: NEGATIVE mg/dL
Ketones, ur: 20 mg/dL — AB
Leukocytes, UA: NEGATIVE
NITRITE: NEGATIVE
Protein, ur: NEGATIVE mg/dL
Specific Gravity, Urine: >1.046 — ABNORMAL HIGH (ref 1.005–1.030)
pH: 5 (ref 5.0–8.0)

## 2017-11-01 LAB — RAPID URINE DRUG SCREEN, HOSP PERFORMED
AMPHETAMINES: NOT DETECTED
Barbiturates: NOT DETECTED
Benzodiazepines: NOT DETECTED
Cocaine: NOT DETECTED
Opiates: NOT DETECTED
Tetrahydrocannabinol: POSITIVE — AB

## 2017-11-01 LAB — MRSA PCR SCREENING: MRSA by PCR: NEGATIVE

## 2017-11-01 MED ORDER — SODIUM CHLORIDE 0.9 % IV SOLN
INTRAVENOUS | Status: DC
  Administered 2017-11-01: 21:00:00 via INTRAVENOUS

## 2017-11-01 MED ORDER — ONDANSETRON HCL 4 MG/2ML IJ SOLN
4.0000 mg | Freq: Four times a day (QID) | INTRAMUSCULAR | Status: DC | PRN
Start: 2017-11-01 — End: 2017-11-02
  Administered 2017-11-02: 4 mg via INTRAVENOUS
  Filled 2017-11-01: qty 2

## 2017-11-01 MED ORDER — HYDROMORPHONE HCL 1 MG/ML IJ SOLN
0.5000 mg | INTRAMUSCULAR | Status: AC | PRN
Start: 2017-11-01 — End: 2017-11-02
  Administered 2017-11-01 – 2017-11-02 (×2): 0.5 mg via INTRAVENOUS
  Filled 2017-11-01 (×2): qty 1

## 2017-11-01 MED ORDER — CLOPIDOGREL BISULFATE 75 MG PO TABS
300.0000 mg | ORAL_TABLET | Freq: Once | ORAL | Status: AC
  Administered 2017-11-01: 300 mg via ORAL
  Filled 2017-11-01: qty 4

## 2017-11-01 MED ORDER — ATORVASTATIN CALCIUM 80 MG PO TABS
80.0000 mg | ORAL_TABLET | Freq: Every day | ORAL | Status: DC
  Administered 2017-11-01 – 2017-11-02 (×2): 80 mg via ORAL
  Filled 2017-11-01 (×2): qty 1

## 2017-11-01 MED ORDER — CLOPIDOGREL BISULFATE 75 MG PO TABS
75.0000 mg | ORAL_TABLET | Freq: Every day | ORAL | Status: DC
  Administered 2017-11-02 – 2017-11-03 (×2): 75 mg via ORAL
  Filled 2017-11-01 (×2): qty 1

## 2017-11-01 NOTE — Evaluation (Signed)
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 Pt currently with functional limitiations due to the deficits listed below (see OT problem list). Pt currently requires (A) for all adls due to balance deficits and L dominant hand affected. Pt eager to participate and very pleasant  Pt will benefit from skilled OT to increase their independence and safety with adls and balance to allow discharge CIR.     Follow Up Recommendations  CIR    Equipment Recommendations  Other (comment)(defer to CIR)    Recommendations for Other Services Rehab consult     Precautions / Restrictions Precautions Precautions: Fall Precaution Comments: seizure      Mobility Bed Mobility Overal bed mobility: Needs Assistance Bed Mobility: Supine to Sit     Supine to sit: Min assist;+2 for physical assistance;+2 for safety/equipment     General bed mobility comments: pt requires (A) to power trunk up from bed surface, pt needs incr time EOB to help with balance  Transfers Overall transfer level: Needs assistance   Transfers: Sit to/from Stand Sit to Stand: +2 physical assistance;Min assist;From elevated surface         General transfer comment: pt able to power up into standing and requires (A) to pivot to chair. Pt unable to unweight R LE     Balance Overall balance assessment: Needs assistance Sitting-balance support: Single extremity supported;Feet supported Sitting balance-Leahy Scale: Fair     Standing balance support: Single extremity supported;During functional activity Standing balance-Leahy Scale: Poor                             ADL either performed or assessed with clinical  judgement   ADL Overall ADL's : Needs assistance/impaired Eating/Feeding: Moderate assistance Eating/Feeding Details (indicate cue type and reason): using R hand and is L hand dominant Grooming: Moderate assistance   Upper Body Bathing: Maximal assistance   Lower Body Bathing: Maximal assistance           Toilet Transfer: +2 for safety/equipment;+2 for physical assistance;Maximal assistance;Stand-pivot Toilet Transfer Details (indicate cue type and reason): pt pushing to the L due to proprioception           General ADL Comments: Pt completed bed mobility, sit <.>stand from bed x5 during session and pushing L due L side weakness     Vision Patient Visual Report: Diplopia Additional Comments: pt reports that vision is best in peripheral fields, pt describes central visual fields blurred and double     Perception     Praxis      Pertinent Vitals/Pain Pain Assessment: Faces Pain Score: 4  Pain Location: head Pain Descriptors / Indicators: Headache Pain Intervention(s): Monitored during session;Repositioned     Hand Dominance Left   Extremity/Trunk Assessment Upper Extremity Assessment Upper Extremity Assessment: LUE deficits/detail LUE Deficits / Details: pt with scapula elevation, elbow flex position, wrist flexed, decr digit extension. pt with lack of awareness in space LUE Sensation: decreased proprioception;decreased light touch LUE Coordination: decreased fine motor;decreased gross motor   Lower Extremity Assessment Lower Extremity Assessment: Defer to PT evaluation   Cervical / Trunk Assessment Cervical / Trunk Assessment: Normal   Communication Communication  Communication: (dysarthria)   Cognition Arousal/Alertness: Awake/alert Behavior During Therapy: WFL for tasks assessed/performed Overall Cognitive Status: History of cognitive impairments - at baseline                                 General Comments: pt likes to joke and following  all commands well   General Comments       Exercises     Shoulder Instructions      Home Living Family/patient expects to be discharged to:: Inpatient rehab Living Arrangements: Spouse/significant other Available Help at Discharge: Family Type of Home: House                              Lives With: Spouse    Prior Functioning/Environment Level of Independence: Independent                 OT Problem List: Decreased strength;Decreased range of motion;Decreased activity tolerance;Impaired balance (sitting and/or standing);Decreased safety awareness;Decreased knowledge of use of DME or AE;Decreased knowledge of precautions;Cardiopulmonary status limiting activity;Impaired UE functional use;Decreased cognition;Decreased coordination;Impaired vision/perception      OT Treatment/Interventions: Self-care/ADL training;Therapeutic exercise;Energy conservation;DME and/or AE instruction;Therapeutic activities;Patient/family education;Balance training;Cognitive remediation/compensation;Visual/perceptual remediation/compensation    OT Goals(Current goals can be found in the care plan section) Acute Rehab OT Goals Patient Stated Goal: to use L hand OT Goal Formulation: With patient Time For Goal Achievement: 11/15/17 Potential to Achieve Goals: Good  OT Frequency: Min 3X/week   Barriers to D/C:            Co-evaluation PT/OT/SLP Co-Evaluation/Treatment: Yes Reason for Co-Treatment: Complexity of the patient's impairments (multi-system involvement);Necessary to address cognition/behavior during functional activity;For patient/therapist safety;To address functional/ADL transfers   OT goals addressed during session: ADL's and self-care;Proper use of Adaptive equipment and DME;Strengthening/ROM      AM-PAC PT "6 Clicks" Daily Activity     Outcome Measure Help from another person eating meals?: A Little Help from another person taking care of personal grooming?: A  Little Help from another person toileting, which includes using toliet, bedpan, or urinal?: A Lot Help from another person bathing (including washing, rinsing, drying)?: A Lot Help from another person to put on and taking off regular upper body clothing?: A Lot Help from another person to put on and taking off regular lower body clothing?: A Lot 6 Click Score: 14   End of Session Equipment Utilized During Treatment: Gait belt Nurse Communication: Mobility status;Precautions  Activity Tolerance: Patient tolerated treatment well Patient left: in chair;with call bell/phone within reach;with family/visitor present;with chair alarm set  OT Visit Diagnosis: Unsteadiness on feet (R26.81)                Time: 1610-9604 OT Time Calculation (min): 43 min Charges:  OT General Charges $OT Visit: 1 Visit OT Evaluation $OT Eval High Complexity: 1 High G-CodesMateo Cooke   OTR/L Pager: [phone] Office: [phone] .   Justin Cooke 11/01/2017, 2:45 PM

## 2017-11-01 NOTE — Plan of Care (Signed)
Pt passed swallow screen with speech and regular diet was ordered.  Will continue to monitor.  Heloise Purpura RN
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
## 2017-11-01 NOTE — Progress Notes (Signed)
Rehab Admissions Coordinator Note:  Patient was screened by Clois Dupes for appropriateness for an Inpatient Acute Rehab Consult per OT recommendation. At this time, we are recommending Inpatient Rehab consult.  Clois Dupes 11/01/2017, 3:27 PM  I can be reached at [phone].
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
## 2017-11-01 NOTE — Progress Notes (Addendum)
STROKE TEAM PROGRESS NOTE  HPI : Justin Cooke is a 52 y.o. male left-handed, PMH of TBI, GERD, HTN, who was last witnessed well by wife at 32 when she left for work and at around noon noticed left sided weakness, dysarthria. He also noted that he started to have double vision which would only be relieved on covering one eye and appeared on seeing from both eyes. He has some nausea but no vomiting now. Had vomiting ear;ier in the day. Mild headache. Denies severe headache. Denies having had such symptoms in the past but said he was suspicious that this was a stroke as he lost control of left side of his body and had severe slurred speech. Denies recent trauma. Denies recent surgery. Denies preceding illnesses or sicknesses. Denies CP SOB cough fever or chills. Wife was at bedside and confirmed the history.   LKW: 0750 hrs 10/31/17 tpa given?: no, outside the window Premorbid modified Rankin scale (mRS): 1   INTERVAL HISTORY His wife is at the bedside.  He reports improved speech but still dysarthric. Angio showed BA fusiform aneurysm. Question if aneursym from past TBI. Dr. Pearlean Brownie to discuss w/ Dr. Corliss Skains. If from trauma, more likelihood of being able to manage worsening.   Vitals:   11/01/17 0600 11/01/17 0700 11/01/17 0730 11/01/17 0800  BP: (!) 145/100 (!) 153/91 (!) 155/95 (!) 147/97  Pulse: (!) 106 (!) 103 (!) 103 (!) 103  Resp: 17 19 16 14   Temp:    98 F (36.7 C)  TempSrc:    Oral  SpO2: (!) 85% 94% 95% 95%  Weight:      Height:        CBC:  CBC Latest Ref Rng & Units 10/31/2017 10/31/2017 06/14/2014  WBC 4.0 - 10.5 K/uL - 10.7(H) -  Hemoglobin 13.0 - 17.0 g/dL 27.2 12.9(L) 16.3  Hematocrit 39.0 - 52.0 % 42.0 39.7 48.0  Platelets 150 - 400 K/uL - 271 -     Comprehensive Metabolic Panel:   CMP Latest Ref Rng & Units 10/31/2017 10/31/2017 06/14/2014  Glucose 65 - 99 mg/dL 536(U) 440(H) 89  BUN 6 - 20 mg/dL 12 11 13   Creatinine 0.61 - 1.24 mg/dL 4.74 2.59 5.63   Sodium 135 - 145 mmol/L 138 135 140  Potassium 3.5 - 5.1 mmol/L 3.9 3.8 4.4  Chloride 101 - 111 mmol/L 100(L) 100(L) 104  CO2 22 - 32 mmol/L - 25 -  Calcium 8.9 - 10.3 mg/dL - 9.0 -  Total Protein 6.5 - 8.1 g/dL - 6.9 -  Total Bilirubin 0.3 - 1.2 mg/dL - 0.6 -  Alkaline Phos 38 - 126 U/L - 79 -  AST 15 - 41 U/L - 74(H) -  ALT 17 - 63 U/L - 74(H) -   Lipid Panel:     Component Value Date/Time   CHOL 218 (H) 11/01/2017 0127   TRIG 86 11/01/2017 0127   HDL 44 11/01/2017 0127   CHOLHDL 5.0 11/01/2017 0127   VLDL 17 11/01/2017 0127   LDLCALC 157 (H) 11/01/2017 0127   HgbA1c:  Lab Results  Component Value Date   HGBA1C 6.6 (H) 11/01/2017   Urine Drug Screen:     Component Value Date/Time   LABOPIA NONE DETECTED 03/16/2009 1723   COCAINSCRNUR NONE DETECTED 03/16/2009 1723   LABBENZ POSITIVE (A) 03/16/2009 1723   AMPHETMU NONE DETECTED 03/16/2009 1723   THCU NONE DETECTED 03/16/2009 1723   LABBARB  03/16/2009 1723    NONE DETECTED  DRUG SCREEN FOR MEDICAL PURPOSES ONLY.  IF CONFIRMATION IS NEEDED FOR ANY PURPOSE, NOTIFY LAB WITHIN 5 DAYS.        LOWEST DETECTABLE LIMITS FOR URINE DRUG SCREEN Drug Class       Cutoff (ng/mL) Amphetamine      1000 Barbiturate      200 Benzodiazepine   200 Tricyclics       300 Opiates          300 Cocaine          300 THC              50    Alcohol Level     Component Value Date/Time   ETH <10 10/31/2017 1947    IMAGING Ct Angio Head W Or Wo Contrast  Result Date: 10/31/2017 CLINICAL DATA:  Code stroke.  52 y/o  M; left-sided deficits. EXAM: CT OF THE HEAD WITHOUT CONTRAST CT ANGIOGRAPHY HEAD AND NECK CT PERFUSION BRAIN TECHNIQUE: CT of the head from skull base to vertex was acquired without intravenous contrast with coronal and sagittal reconstructions. Multidetector CT imaging of the head and neck was performed using the standard protocol during bolus administration of intravenous contrast. Multiplanar CT image  reconstructions and MIPs were obtained to evaluate the vascular anatomy. Carotid stenosis measurements (when applicable) are obtained utilizing NASCET criteria, using the distal internal carotid diameter as the denominator. Multiphase CT imaging of the brain was performed following IV bolus contrast injection. Subsequent parametric perfusion maps were calculated using RAPID software. CONTRAST:  ISOVUE-370 IOPAMIDOL (ISOVUE-370) INJECTION 76% COMPARISON:  06/14/2014 and 03/17/2009 CT of the head. FINDINGS: CT HEAD FINDINGS Brain: Stable chronic encephalomalacia in the left frontoparietal junction and within the right greater than left frontal orbital cortices. No large acute stroke identified. No hemorrhage or mass effect. No large supratentorial vascular territory acute stroke. Mild chronic microvascular ischemic changes and parenchymal volume loss of the brain are stable. Vascular: Right eccentric high attenuation within the wall of aneurysmal basilar artery. Skull: Normal. Negative for fracture or focal lesion. Sinuses/Orbits: Extensive paranasal sinus disease with diffuse mucosal thickening and partial opacification of maxillary as well as ethmoid air cells. Normal aeration of mastoid air cells. Bilateral intra-ocular lens replacement. Other: None. ASPECTS ([place] Stroke Program Early CT Score) - Ganglionic level infarction (caudate, lentiform nuclei, internal capsule, insula, M1-M3 cortex): 7 - Supraganglionic infarction (M4-M6 cortex): 3 Total score (0-10 with 10 being normal): 10 Review of the MIP images confirms the above findings CTA NECK FINDINGS Aortic arch: Standard branching. Imaged portion shows no evidence of aneurysm or dissection. No significant stenosis of the major arch vessel origins. Right carotid system: No evidence of dissection, stenosis (50% or greater) or occlusion. Left carotid system: No evidence of dissection, stenosis (50% or greater) or occlusion. Vertebral arteries: Codominant.  No evidence of dissection, stenosis (50% or greater) or occlusion. Skeleton: Mild cervical spondylosis with multilevel disc and facet degenerative changes. No high-grade bony canal stenosis. Other neck: Negative. Upper chest: Negative. Review of the MIP images confirms the above findings CTA HEAD FINDINGS Anterior circulation: No significant stenosis, proximal occlusion, aneurysm, or vascular malformation. 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 Right eccentric high attenuation within the right aspect of the mid basilar artery does not opacify with contrast and there is no dissection flap compatible with intramural hematoma. Bilateral vertebral arteries, bilateral PICA, bilateral SCA, and bilateral PCA are patent without significant stenosis, aneurysm, or occlusion. AICA are not identified, possibly  hypoplastic or absent. Venous sinuses: As permitted by contrast timing, patent. Anatomic variants: Patent anterior communicating artery. No posterior communicating artery identified, likely hypoplastic or absent. Delayed phase: No abnormal intracranial enhancement. Review of the MIP images confirms the above findings CT Brain Perfusion Findings: CBF (<30%) Volume: 4mL Perfusion (Tmax>6.0s) volume: 88mL Mismatch Volume: 84mL Infarction Location:Core infarct, CBF (<30%), in the left occipital lobe PCA distribution. Ischemic penumbra, perfusion (Tmax>6.0s), throughout the cerebellum, left-greater-than-right occipital lobes, and left posterior thalamus. Additionally there is increased T-max >4.0s throughout the entire posterior circulation. IMPRESSION: 1. No acute abnormality on noncontrast CT of head.  Aspects is 10. 2. 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 3. Eccentric hyperdense wall thickening and luminal narrowing within the proximal to mid basilar artery that does not enhance on the angiogram. Findings probably represent an intramural hematoma or  possibly dissection although no dissection flap is identified. 4. Otherwise no large vessel occlusion, high-grade stenosis, or aneurysm of the anterior posterior intracranial circulation. 5. No significant stenosis of the carotid or vertebral arteries by NASCET criteria. 6. Perfusion anomaly throughout the posterior circulation with mildly increased T-max. RAPID calculated core infarct of 4 cc in the left occipital lobe and RAPID calculated ischemic penumbra of 84 cc in the left-greater-than-right PCA distributions as well as cerebellum. 7. Chronic left parietal and bilateral frontal orbital areas of encephalomalacia. Stable background of chronic microvascular ischemic changes of white matter and parenchymal volume loss of the brain. 8. Extensive paranasal sinus disease. These results were called by telephone at the time of interpretation on 10/31/2017 at 8:21 pm to Dr. Wilford Corner, who verbally acknowledged these results. Electronically Signed   By: Mitzi Hansen M.D.   On: 10/31/2017 20:47   Ct Angio Neck W Or Wo Contrast  Result Date: 10/31/2017 CLINICAL DATA:  Code stroke.  53 y/o  M; left-sided deficits. EXAM: CT OF THE HEAD WITHOUT CONTRAST CT ANGIOGRAPHY HEAD AND NECK CT PERFUSION BRAIN TECHNIQUE: CT of the head from skull base to vertex was acquired without intravenous contrast with coronal and sagittal reconstructions. Multidetector CT imaging of the head and neck was performed using the standard protocol during bolus administration of intravenous contrast. Multiplanar CT image reconstructions and MIPs were obtained to evaluate the vascular anatomy. Carotid stenosis measurements (when applicable) are obtained utilizing NASCET criteria, using the distal internal carotid diameter as the denominator. Multiphase CT imaging of the brain was performed following IV bolus contrast injection. Subsequent parametric perfusion maps were calculated using RAPID software. CONTRAST:  ISOVUE-370 IOPAMIDOL  (ISOVUE-370) INJECTION 76% COMPARISON:  06/14/2014 and 03/17/2009 CT of the head. FINDINGS: CT HEAD FINDINGS Brain: Stable chronic encephalomalacia in the left frontoparietal junction and within the right greater than left frontal orbital cortices. No large acute stroke identified. No hemorrhage or mass effect. No large supratentorial vascular territory acute stroke. Mild chronic microvascular ischemic changes and parenchymal volume loss of the brain are stable. Vascular: Right eccentric high attenuation within the wall of aneurysmal basilar artery. Skull: Normal. Negative for fracture or focal lesion. Sinuses/Orbits: Extensive paranasal sinus disease with diffuse mucosal thickening and partial opacification of maxillary as well as ethmoid air cells. Normal aeration of mastoid air cells. Bilateral intra-ocular lens replacement. Other: None. ASPECTS ([place] Stroke Program Early CT Score) - Ganglionic level infarction (caudate, lentiform nuclei, internal capsule, insula, M1-M3 cortex): 7 - Supraganglionic infarction (M4-M6 cortex): 3 Total score (0-10 with 10 being normal): 10 Review of the MIP images confirms the above findings CTA NECK FINDINGS  Aortic arch: Standard branching. Imaged portion shows no evidence of aneurysm or dissection. No significant stenosis of the major arch vessel origins. Right carotid system: No evidence of dissection, stenosis (50% or greater) or occlusion. Left carotid system: No evidence of dissection, stenosis (50% or greater) or occlusion. Vertebral arteries: Codominant. No evidence of dissection, stenosis (50% or greater) or occlusion. Skeleton: Mild cervical spondylosis with multilevel disc and facet degenerative changes. No high-grade bony canal stenosis. Other neck: Negative. Upper chest: Negative. Review of the MIP images confirms the above findings CTA HEAD FINDINGS Anterior circulation: No significant stenosis, proximal occlusion, aneurysm, or vascular malformation. 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 Right eccentric high attenuation within the right aspect of the mid basilar artery does not opacify with contrast and there is no dissection flap compatible with intramural hematoma. Bilateral vertebral arteries, bilateral PICA, bilateral SCA, and bilateral PCA are patent without significant stenosis, aneurysm, or occlusion. AICA are not identified, possibly hypoplastic or absent. Venous sinuses: As permitted by contrast timing, patent. Anatomic variants: Patent anterior communicating artery. No posterior communicating artery identified, likely hypoplastic or absent. Delayed phase: No abnormal intracranial enhancement. Review of the MIP images confirms the above findings CT Brain Perfusion Findings: CBF (<30%) Volume: 4mL Perfusion (Tmax>6.0s) volume: 88mL Mismatch Volume: 84mL Infarction Location:Core infarct, CBF (<30%), in the left occipital lobe PCA distribution. Ischemic penumbra, perfusion (Tmax>6.0s), throughout the cerebellum, left-greater-than-right occipital lobes, and left posterior thalamus. Additionally there is increased T-max >4.0s throughout the entire posterior circulation. IMPRESSION: 1. No acute abnormality on noncontrast CT of head.  Aspects is 10. 2. 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 3. Eccentric hyperdense wall thickening and luminal narrowing within the proximal to mid basilar artery that does not enhance on the angiogram. Findings probably represent an intramural hematoma or possibly dissection although no dissection flap is identified. 4. Otherwise no large vessel occlusion, high-grade stenosis, or aneurysm of the anterior posterior intracranial circulation. 5. No significant stenosis of the carotid or vertebral arteries by NASCET criteria. 6. Perfusion anomaly throughout the posterior circulation with mildly increased T-max. RAPID calculated core infarct of 4 cc in the left occipital  lobe and RAPID calculated ischemic penumbra of 84 cc in the left-greater-than-right PCA distributions as well as cerebellum. 7. Chronic left parietal and bilateral frontal orbital areas of encephalomalacia. Stable background of chronic microvascular ischemic changes of white matter and parenchymal volume loss of the brain. 8. Extensive paranasal sinus disease. These results were called by telephone at the time of interpretation on 10/31/2017 at 8:21 pm to Dr. Wilford Corner, who verbally acknowledged these results. Electronically Signed   By: Mitzi Hansen M.D.   On: 10/31/2017 20:47   Mr Brain Wo Contrast  Addendum Date: 10/31/2017   ADDENDUM REPORT: 10/31/2017 21:55 ADDENDUM: Susceptibility artifact associated with tortuous RIGHT vertebral artery concerning for dissection and intramural hematoma, limited assessment without T1 or T2 sequences. Electronically Signed   By: Awilda Metro M.D.   On: 10/31/2017 21:55   Result Date: 10/31/2017 CLINICAL DATA:  Slurred speech, LEFT-sided weakness, last seen normal this morning. Symptoms began this afternoon. History of traumatic brain injury, seizures, alcohol abuse. EXAM: MRI HEAD WITHOUT CONTRAST TECHNIQUE: Axial and coronal diffusion weighted imaging, axial T2 FLAIR and axial SWAN sequences obtained on a 3 tesla scanner. COMPARISON:  CT HEAD April 30th 2019 FINDINGS: INTRACRANIAL CONTENTS: 10 x 16 mm reduced diffusion RIGHT inferior pons with low ADC values 10 x 4 mm reduced  diffusion LEFT mesial superior pons with intermediate ADC values, potentially too small to localize. Multiple chronic micro hemorrhages in posterior circulation versus motion artifact. Moderate parenchymal brain volume loss, ex vacuo dilatation bilateral frontal horns. Small area LEFT frontoparietal lobe encephalomalacia. Patchy supratentorial white matter FLAIR T2 hyperintensities. Bilateral mesial inferior frontal lobe encephalomalacia. No abnormal extra-axial fluid collections.  VASCULAR: Ectatic basilar artery, limited evaluation due to limited protocol. SKULL AND UPPER CERVICAL SPINE: Nondiagnostic assessment. SINUSES/ORBITS: Limited assessment.  Severe pan paranasal sinusitis. OTHER: None. IMPRESSION: 1. Acute RIGHT inferior pons infarct. Acute versus subacute LEFT superior pons infarct. 2. 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 3. Bilateral frontal lobe encephalomalacia compatible with traumatic brain injury. 4. Moderate parenchymal brain volume loss and mild chronic small vessel ischemic disease. 5. Critical Value/emergent results text paged to Dr.ASHISH ARORA via [employer] secure system on 10/31/2017 at 9:16 pm, including interpreting physician's phone number. Electronically Signed: By: Awilda Metro M.D. On: 10/31/2017 21:16   Ct Cerebral Perfusion W Contrast  Result Date: 10/31/2017 CLINICAL DATA:  Code stroke.  52 y/o  M; left-sided deficits. EXAM: CT OF THE HEAD WITHOUT CONTRAST CT ANGIOGRAPHY HEAD AND NECK CT PERFUSION BRAIN TECHNIQUE: CT of the head from skull base to vertex was acquired without intravenous contrast with coronal and sagittal reconstructions. Multidetector CT imaging of the head and neck was performed using the standard protocol during bolus administration of intravenous contrast. Multiplanar CT image reconstructions and MIPs were obtained to evaluate the vascular anatomy. Carotid stenosis measurements (when applicable) are obtained utilizing NASCET criteria, using the distal internal carotid diameter as the denominator. Multiphase CT imaging of the brain was performed following IV bolus contrast injection. Subsequent parametric perfusion maps were calculated using RAPID software. CONTRAST:  ISOVUE-370 IOPAMIDOL (ISOVUE-370) INJECTION 76% COMPARISON:  06/14/2014 and 03/17/2009 CT of the head. FINDINGS: CT HEAD FINDINGS Brain: Stable chronic encephalomalacia in the left  frontoparietal junction and within the right greater than left frontal orbital cortices. No large acute stroke identified. No hemorrhage or mass effect. No large supratentorial vascular territory acute stroke. Mild chronic microvascular ischemic changes and parenchymal volume loss of the brain are stable. Vascular: Right eccentric high attenuation within the wall of aneurysmal basilar artery. Skull: Normal. Negative for fracture or focal lesion. Sinuses/Orbits: Extensive paranasal sinus disease with diffuse mucosal thickening and partial opacification of maxillary as well as ethmoid air cells. Normal aeration of mastoid air cells. Bilateral intra-ocular lens replacement. Other: None. ASPECTS ([place] Stroke Program Early CT Score) - Ganglionic level infarction (caudate, lentiform nuclei, internal capsule, insula, M1-M3 cortex): 7 - Supraganglionic infarction (M4-M6 cortex): 3 Total score (0-10 with 10 being normal): 10 Review of the MIP images confirms the above findings CTA NECK FINDINGS Aortic arch: Standard branching. Imaged portion shows no evidence of aneurysm or dissection. No significant stenosis of the major arch vessel origins. Right carotid system: No evidence of dissection, stenosis (50% or greater) or occlusion. Left carotid system: No evidence of dissection, stenosis (50% or greater) or occlusion. Vertebral arteries: Codominant. No evidence of dissection, stenosis (50% or greater) or occlusion. Skeleton: Mild cervical spondylosis with multilevel disc and facet degenerative changes. No high-grade bony canal stenosis. Other neck: Negative. Upper chest: Negative. Review of the MIP images confirms the above findings CTA HEAD FINDINGS Anterior circulation: No significant stenosis, proximal occlusion, aneurysm, or vascular malformation. 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 Right eccentric high attenuation within the right aspect  of the mid basilar artery does not  opacify with contrast and there is no dissection flap compatible with intramural hematoma. Bilateral vertebral arteries, bilateral PICA, bilateral SCA, and bilateral PCA are patent without significant stenosis, aneurysm, or occlusion. AICA are not identified, possibly hypoplastic or absent. Venous sinuses: As permitted by contrast timing, patent. Anatomic variants: Patent anterior communicating artery. No posterior communicating artery identified, likely hypoplastic or absent. Delayed phase: No abnormal intracranial enhancement. Review of the MIP images confirms the above findings CT Brain Perfusion Findings: CBF (<30%) Volume: 4mL Perfusion (Tmax>6.0s) volume: 88mL Mismatch Volume: 84mL Infarction Location:Core infarct, CBF (<30%), in the left occipital lobe PCA distribution. Ischemic penumbra, perfusion (Tmax>6.0s), throughout the cerebellum, left-greater-than-right occipital lobes, and left posterior thalamus. Additionally there is increased T-max >4.0s throughout the entire posterior circulation. IMPRESSION: 1. No acute abnormality on noncontrast CT of head.  Aspects is 10. 2. 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 3. Eccentric hyperdense wall thickening and luminal narrowing within the proximal to mid basilar artery that does not enhance on the angiogram. Findings probably represent an intramural hematoma or possibly dissection although no dissection flap is identified. 4. Otherwise no large vessel occlusion, high-grade stenosis, or aneurysm of the anterior posterior intracranial circulation. 5. No significant stenosis of the carotid or vertebral arteries by NASCET criteria. 6. Perfusion anomaly throughout the posterior circulation with mildly increased T-max. RAPID calculated core infarct of 4 cc in the left occipital lobe and RAPID calculated ischemic penumbra of 84 cc in the left-greater-than-right PCA distributions as well as cerebellum. 7. Chronic left parietal and bilateral  frontal orbital areas of encephalomalacia. Stable background of chronic microvascular ischemic changes of white matter and parenchymal volume loss of the brain. 8. Extensive paranasal sinus disease. These results were called by telephone at the time of interpretation on 10/31/2017 at 8:21 pm to Dr. Wilford Corner, who verbally acknowledged these results. Electronically Signed   By: Mitzi Hansen M.D.   On: 10/31/2017 20:47   Dg Chest Port 1 View  Result Date: 11/01/2017 CLINICAL DATA:  Stroke EXAM: PORTABLE CHEST 1 VIEW COMPARISON:  06/14/2014 FINDINGS: Mild elevation of the right diaphragm. No acute airspace disease or pleural effusion. Cardiomediastinal silhouette within normal limits. No pneumothorax. Old left third rib fracture. IMPRESSION: No active disease. Electronically Signed   By: Jasmine Pang M.D.   On: 11/01/2017 01:58   Ct Head Code Stroke Wo Contrast`  Result Date: 10/31/2017 CLINICAL DATA:  Code stroke.  52 y/o  M; left-sided deficits. EXAM: CT OF THE HEAD WITHOUT CONTRAST CT ANGIOGRAPHY HEAD AND NECK CT PERFUSION BRAIN TECHNIQUE: CT of the head from skull base to vertex was acquired without intravenous contrast with coronal and sagittal reconstructions. Multidetector CT imaging of the head and neck was performed using the standard protocol during bolus administration of intravenous contrast. Multiplanar CT image reconstructions and MIPs were obtained to evaluate the vascular anatomy. Carotid stenosis measurements (when applicable) are obtained utilizing NASCET criteria, using the distal internal carotid diameter as the denominator. Multiphase CT imaging of the brain was performed following IV bolus contrast injection. Subsequent parametric perfusion maps were calculated using RAPID software. CONTRAST:  ISOVUE-370 IOPAMIDOL (ISOVUE-370) INJECTION 76% COMPARISON:  06/14/2014 and 03/17/2009 CT of the head. FINDINGS: CT HEAD FINDINGS Brain: Stable chronic encephalomalacia in the left  frontoparietal junction and within the right greater than left frontal orbital cortices. No large acute stroke identified. No hemorrhage or mass effect. No large supratentorial vascular territory acute stroke. Mild chronic  microvascular ischemic changes and parenchymal volume loss of the brain are stable. Vascular: Right eccentric high attenuation within the wall of aneurysmal basilar artery. Skull: Normal. Negative for fracture or focal lesion. Sinuses/Orbits: Extensive paranasal sinus disease with diffuse mucosal thickening and partial opacification of maxillary as well as ethmoid air cells. Normal aeration of mastoid air cells. Bilateral intra-ocular lens replacement. Other: None. ASPECTS ([place] Stroke Program Early CT Score) -

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
## 2017-11-02 ENCOUNTER — Inpatient Hospital Stay (HOSPITAL_COMMUNITY): Payer: PRIVATE HEALTH INSURANCE

## 2017-11-02 ENCOUNTER — Encounter (HOSPITAL_COMMUNITY): Payer: Self-pay | Admitting: Interventional Radiology

## 2017-11-02 DIAGNOSIS — I6350 Cerebral infarction due to unspecified occlusion or stenosis of unspecified cerebral artery: Secondary | ICD-10-CM

## 2017-11-02 DIAGNOSIS — I340 Nonrheumatic mitral (valve) insufficiency: Secondary | ICD-10-CM

## 2017-11-02 DIAGNOSIS — G8194 Hemiplegia, unspecified affecting left nondominant side: Secondary | ICD-10-CM

## 2017-11-02 LAB — ECHOCARDIOGRAM COMPLETE
Height: 74 in
Weight: 4130.54 oz

## 2017-11-02 MED ORDER — AMLODIPINE BESYLATE 2.5 MG PO TABS
2.5000 mg | ORAL_TABLET | Freq: Every day | ORAL | Status: DC
  Administered 2017-11-02 – 2017-11-03 (×2): 2.5 mg via ORAL
  Filled 2017-11-02 (×2): qty 1

## 2017-11-02 MED ORDER — ONDANSETRON HCL 4 MG/2ML IJ SOLN
4.0000 mg | Freq: Four times a day (QID) | INTRAMUSCULAR | Status: DC
  Administered 2017-11-02 – 2017-11-03 (×3): 4 mg via INTRAVENOUS
  Filled 2017-11-02 (×3): qty 2

## 2017-11-02 NOTE — H&P (Signed)
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 significant stenosis, aneurysm, or occlusion. AICA are not identified, possibly hypoplastic or absent. Venous sinuses: As permitted by contrast timing, patent. Anatomic variants: Patent anterior communicating artery. No posterior communicating artery identified, likely hypoplastic or absent. Delayed phase: No abnormal intracranial enhancement. Review of the MIP images confirms the above findings CT Brain Perfusion Findings: CBF (<30%) Volume: 63m Perfusion (Tmax>6.0s) volume: 834mMismatch Volume: 8477mnfarction Location:Core infarct, CBF (<30%), in the left occipital lobe PCA distribution. Ischemic penumbra, perfusion (Tmax>6.0s), throughout the cerebellum, left-greater-than-right occipital lobes, and left posterior thalamus. Additionally there is increased T-max >4.0s throughout the entire posterior circulation.  IMPRESSION: 1. No acute abnormality on noncontrast CT of head.  Aspects is 10. 2. 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 3. Eccentric hyperdense wall thickening and luminal narrowing within the proximal to mid basilar artery that does not enhance on the angiogram. Findings probably represent an intramural hematoma or possibly dissection although no dissection flap is identified. 4. Otherwise no large vessel occlusion, high-grade stenosis, or aneurysm of the anterior posterior intracranial circulation. 5. No significant stenosis of the carotid or vertebral arteries by NASCET criteria. 6. Perfusion anomaly throughout the posterior circulation with mildly increased T-max. RAPID calculated core infarct of 4 cc in the left occipital lobe and RAPID calculated ischemic penumbra of 84 cc in the left-greater-than-right PCA distributions as well as cerebellum. 7. Chronic left parietal and bilateral frontal orbital areas of encephalomalacia. Stable background of chronic microvascular ischemic changes of white matter and parenchymal volume loss of the brain. 8. Extensive paranasal sinus disease. These results were called by telephone at the time of interpretation on 10/31/2017 at 8:21 pm to Dr. AroRory Percyho verbally acknowledged these results. Electronically Signed   By: LanKristine GarbeD.   On: 10/31/2017 20:47   Mr Brain Wo Contrast  Addendum Date: 10/31/2017   ADDENDUM REPORT: 10/31/2017 21:55 ADDENDUM: Susceptibility artifact associated with tortuous RIGHT vertebral artery concerning for dissection and intramural hematoma, limited assessment without T1 or T2 sequences. Electronically Signed   By: CouElon AlasD.   On: 10/31/2017 21:55   Result Date: 10/31/2017 CLINICAL DATA:  Slurred speech, LEFT-sided weakness, last seen normal this morning. Symptoms began this afternoon. History of traumatic brain injury, seizures, alcohol abuse. EXAM: MRI HEAD WITHOUT CONTRAST  TECHNIQUE: Axial and coronal diffusion weighted imaging, axial T2 FLAIR and axial SWAN sequences obtained on a 3 tesla scanner. COMPARISON:  CT HEAD April 30th 2019 FINDINGS: INTRACRANIAL CONTENTS: 10 x 16 mm reduced diffusion RIGHT inferior pons with low ADC values 10 x 4 mm reduced diffusion LEFT mesial superior pons with intermediate ADC values, potentially too small to localize. Multiple chronic micro hemorrhages in posterior circulation versus motion artifact. Moderate parenchymal brain volume loss, ex vacuo  dilatation bilateral frontal horns. Small area LEFT frontoparietal lobe encephalomalacia. Patchy supratentorial white matter FLAIR T2 hyperintensities. Bilateral mesial inferior frontal lobe encephalomalacia. No abnormal extra-axial fluid collections. VASCULAR: Ectatic basilar artery, limited evaluation due to limited protocol. SKULL AND UPPER CERVICAL SPINE: Nondiagnostic assessment. SINUSES/ORBITS: Limited assessment.  Severe pan paranasal sinusitis. OTHER: None. IMPRESSION: 1. Acute RIGHT inferior pons infarct. Acute versus subacute LEFT superior pons infarct. 2. 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 3. Bilateral frontal lobe encephalomalacia compatible with traumatic brain injury. 4. Moderate parenchymal brain volume loss and mild chronic small vessel ischemic disease. 5. Critical Value/emergent results text paged to [facility] via [employer] secure system on 10/31/2017 at 9:16 pm, including interpreting physician's phone number. Electronically Signed: By: Elon Alas M.D. On: 10/31/2017 21:16   Ct Cerebral Perfusion W Contrast  Result Date: 10/31/2017 CLINICAL DATA:  Code stroke.  52 y/o  M; left-sided deficits. EXAM: CT OF THE HEAD WITHOUT CONTRAST CT ANGIOGRAPHY HEAD AND NECK CT PERFUSION BRAIN TECHNIQUE: CT of the head from skull base to vertex was acquired without intravenous contrast with coronal and sagittal  reconstructions. Multidetector CT imaging of the head and neck was performed using the standard protocol during bolus administration of intravenous contrast. Multiplanar CT image reconstructions and MIPs were obtained to evaluate the vascular anatomy. Carotid stenosis measurements (when applicable) are obtained utilizing NASCET criteria, using the distal internal carotid diameter as the denominator. Multiphase CT imaging of the brain was performed following IV bolus contrast injection. Subsequent parametric perfusion maps were calculated using RAPID software. CONTRAST:  182m ISOVUE-370 IOPAMIDOL (ISOVUE-370) INJECTION 76% COMPARISON:  06/14/2014 and 03/17/2009 CT of the head. FINDINGS: CT HEAD FINDINGS Brain: Stable chronic encephalomalacia in the left frontoparietal junction and within the right greater than left frontal orbital cortices. No large acute stroke identified. No hemorrhage or mass effect. No large supratentorial vascular territory acute stroke. Mild chronic microvascular ischemic changes and parenchymal volume loss of the brain are stable. Vascular: Right eccentric high attenuation within the wall of aneurysmal basilar artery. Skull: Normal. Negative for fracture or focal lesion. Sinuses/Orbits: Extensive paranasal sinus disease with diffuse mucosal thickening and partial opacification of maxillary as well as ethmoid air cells. Normal aeration of mastoid air cells. Bilateral intra-ocular lens replacement. Other: None. ASPECTS ([place] Early CT Score) - Ganglionic level infarction (caudate, lentiform nuclei, internal capsule, insula, M1-M3 cortex): 7 - Supraganglionic infarction (M4-M6 cortex): 3 Total score (0-10 with 10 being normal): 10 Review of the MIP images confirms the above findings CTA NECK FINDINGS Aortic arch: Standard branching. Imaged portion shows no evidence of aneurysm or dissection. No significant stenosis of the major arch vessel origins. Right carotid system: No  evidence of dissection, stenosis (50% or greater) or occlusion. Left carotid system: No evidence of dissection, stenosis (50% or greater) or occlusion. Vertebral arteries: Codominant. No evidence of dissection, stenosis (50% or greater) or occlusion. Skeleton: Mild cervical spondylosis with multilevel disc and facet degenerative changes. No high-grade bony canal stenosis. Other neck: Negative. Upper chest: Negative. Review of the MIP images confirms the above findings CTA HEAD FINDINGS Anterior circulation: No significant stenosis, proximal occlusion, aneurysm, or vascular malformation. 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 Right eccentric high attenuation within the right aspect of the mid basilar artery does not opacify with contrast and there is no dissection flap compatible with intramural hematoma. Bilateral vertebral arteries, bilateral PICA, bilateral SCA, and bilateral PCA are  patent without significant stenosis, aneurysm, or occlusion. AICA are not identified, possibly hypoplastic or absent. Venous sinuses: As permitted by contrast timing, patent. Anatomic variants: Patent anterior communicating artery. No posterior communicating artery identified, likely hypoplastic or absent. Delayed phase: No abnormal intracranial enhancement. Review of the MIP images confirms the above findings CT Brain Perfusion Findings: CBF (<30%) Volume: 78m Perfusion (Tmax>6.0s) volume: 856mMismatch Volume: 8411mnfarction Location:Core infarct, CBF (<30%), in the left occipital lobe PCA distribution. Ischemic penumbra, perfusion (Tmax>6.0s), throughout the cerebellum, left-greater-than-right occipital lobes, and left posterior thalamus. Additionally there is increased T-max >4.0s throughout the entire posterior circulation. IMPRESSION: 1. No acute abnormality on noncontrast CT of head.  Aspects is 10. 2. 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 3. Eccentric  hyperdense wall thickening and luminal narrowing within the proximal to mid basilar artery that does not enhance on the angiogram. Findings probably represent an intramural hematoma or possibly dissection although no dissection flap is identified. 4. Otherwise no large vessel occlusion, high-grade stenosis, or aneurysm of the anterior posterior intracranial circulation. 5. No significant stenosis of the carotid or vertebral arteries by NASCET criteria. 6. Perfusion anomaly throughout the posterior circulation with mildly increased T-max. RAPID calculated core infarct of 4 cc in the left occipital lobe and RAPID calculated ischemic penumbra of 84 cc in the left-greater-than-right PCA distributions as well as cerebellum. 7. Chronic left parietal and bilateral frontal orbital areas of encephalomalacia. Stable background of chronic microvascular ischemic changes of white matter and parenchymal volume loss of the brain. 8. Extensive paranasal sinus disease. These results were called by telephone at the time of interpretation on 10/31/2017 at 8:21 pm to Dr. AroRory Percyho verbally acknowledged these results. Electronically Signed   By: LanKristine GarbeD.   On: 10/31/2017 20:47   Dg Chest Port 1 View  Result Date: 11/01/2017 CLINICAL DATA:  Stroke EXAM: PORTABLE CHEST 1 VIEW COMPARISON:  06/14/2014 FINDINGS: Mild elevation of the right diaphragm. No acute airspace disease or pleural effusion. Cardiomediastinal silhouette within normal limits. No pneumothorax. Old left third rib fracture. IMPRESSION: No active disease. Electronically Signed   By: KimDonavan FoilD.   On: 11/01/2017 01:58   Ct Head Code Stroke Wo Contrast`  Result Date: 10/31/2017 CLINICAL DATA:  Code stroke.  [age]o  M; left-sided deficits. EXAM: CT OF THE HEAD WITHOUT CONTRAST CT ANGIOGRAPHY HEAD AND NECK CT PERFUSION BRAIN TECHNIQUE: CT of the head from skull base to vertex was acquired without intravenous contrast with coronal and sagittal  reconstructions. Multidetector CT imaging of the head and neck was performed using the standard protocol during bolus administration of intravenous contrast. Multiplanar CT image reconstructions and MIPs were obtained to evaluate the vascular anatomy. Carotid stenosis measurements (when applicable) are obtained utilizing NASCET criteria, using the distal internal carotid diameter as the denominator. Multiphase CT imaging of the brain was performed following IV bolus contrast injection. Subsequent parametric perfusion maps were calculated using RAPID software. CONTRAST:  100m44mOVUE-370 IOPAMIDOL (ISOVUE-370) INJECTION 76% COMPARISON:  06/14/2014 and 03/17/2009 CT of the head. FINDINGS: CT HEAD FINDINGS Brain: Stable chronic encephalomalacia in the left frontoparietal junction and within the right greater than left frontal orbital cortices. No large acute stroke identified. No hemorrhage or mass effect. No large supratentorial vascular territory acute stroke. Mild chronic microvascular ischemic changes and parenchymal volume loss of the brain are stable. Vascular: Right eccentric high attenuation within the wall of aneurysmal basilar artery. Skull: Normal. Negative for fracture or focal lesion.  Sinuses/Orbits: Extensive paranasal sinus disease with diffuse mucosal thickening and partial opacification of maxillary as well as ethmoid air cells. Normal aeration of mastoid air cells. Bilateral intra-ocular lens replacement. Other: None. ASPECTS ([place] Stroke Program Early CT Score) - Ganglionic level infarction (caudate, lentiform nuclei, internal capsule, insula, M1-M3 cortex): 7 - Supraganglionic infarction (M4-M6 cortex): 3 Total score (0-10 with 10 being normal): 10 Review of the MIP images confirms the above findings CTA NECK FINDINGS Aortic arch: Standard branching. Imaged portion shows no evidence of aneurysm or dissection. No significant stenosis of the major arch vessel origins. Right carotid system: No  evidence of dissection, stenosis (50% or greater) or occlusion. Left carotid system: No evidence of dissection, stenosis (50% or greater) or occlusion. Vertebral arteries: Codominant. No evidence of dissection, stenosis (50% or greater) or occlusion. Skeleton: Mild cervical spondylosis with multilevel disc and facet degenerative changes. No high-grade bony canal stenosis. Other neck: Negative. Upper chest: Negative. Review of the MIP images confirms the above findings CTA HEAD FINDINGS Anterior circulation: No significant stenosis, proximal occlusion, aneurysm, or vascular malformation. 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 Right eccentric high attenuation within the right aspect of the mid basilar artery does not opacify with contrast and there is no dissection flap compatible with intramural hematoma. Bilateral vertebral arteries, bilateral PICA, bilateral SCA, and bilateral PCA are patent without significant stenosis, aneurysm, or occlusion. AICA are not identified, possibly hypoplastic or absent. Venous sinuses: As permitted by contrast timing, patent. Anatomic variants: Patent anterior communicating artery. No posterior communicating artery identified, likely hypoplastic or absent. Delayed phase: No abnormal intracranial enhancement. Review of the MIP images confirms the above findings CT Brain Perfusion Findings: CBF (<30%) Volume: 78m Perfusion (Tmax>6.0s) volume: 829mMismatch Volume: 8427mnfarction Location:Core infarct, CBF (<30%), in the left occipital lobe PCA distribution. Ischemic penumbra, perfusion (Tmax>6.0s), throughout the cerebellum, left-greater-than-right occipital lobes, and left posterior thalamus. Additionally there is increased T-max >4.0s throughout the entire posterior circulation. IMPRESSION: 1. No acute abnormality on noncontrast CT of head.  Aspects is 10. 2. 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 3. Eccentric  hyperdense wall thickening and luminal narrowing within the proximal to mid basilar artery that does not enhance on the angiogram. Findings probably represent an intramural hematoma or possibly dissection although no dissection flap is identified. 4. Otherwise no large vessel occlusion, high-grade stenosis, or aneurysm of the anterior posterior intracranial circulation. 5. No significant stenosis of the carotid or vertebral arteries by NASCET criteria. 6. Perfusion anomaly throughout the posterior circulation with mildly increased T-max. RAPID calculated core infarct of 4 cc in the left occipital lobe and RAPID calculated ischemic penumbra of 84 cc in the left-greater-than-right PCA distributions as well as cerebellum. 7. Chronic left parietal and bilateral frontal orbital areas of encephalomalacia. Stable background of chronic microvascular ischemic changes of white matter and parenchymal volume loss of the brain. 8. Extensive paranasal sinus disease. These results were called by telephone at the time of interpretation on 10/31/2017 at 8:21 pm to Dr. AroRory Percyho verbally acknowledged these results. Electronically Signed   By: LanKristine GarbeD.   On: 10/31/2017 20:47   Ir Angio Intra Extracran Sel Com Carotid Innominate Bilat Mod Sed  Result Date: 11/02/2017 CLINICAL DATA:  Acute onset of left-sided weakness, blurred vision, diplopia and dysarthria. Abnormal CT angiogram of the head and neck. EXAM: IR BILATERAL ANGIO VERTERBRAL SELECTIVE SUBCALVIAN INNOMNATE WITH MODERATE SEDATION; BILATERAL COMMON CAROTID AND INNOMINATE ANGIOGRAPHY COMPARISON:  Angiogram CT angiogram of the head and neck 10/31/2017 and MRI of the brain 10/31/2017. MEDICATIONS: Heparin 1000 units IV; no antibiotic was administered within 1 hour of the procedure. ANESTHESIA/SEDATION: Mac anesthesia as per the Department of Anesthesiology at [place]:  Isovue 300 approximately 65 mL. FLUOROSCOPY TIME:  Fluoroscopy  Time: 50 minutes 24 seconds (2396 mGy). COMPLICATIONS: None immediate. TECHNIQUE: Informed written consent was obtained from the patient after a thorough discussion of the procedural risks, benefits and alternatives. All questions were addressed. Maximal Sterile Barrier Technique was utilized including caps, mask, sterile gowns, sterile gloves, sterile drape, hand hygiene and skin antiseptic. A timeout was performed prior to the initiation of the procedure. The right groin was prepped and draped in the usual sterile fashion. Thereafter using modified Seldinger technique, transfemoral access into the right common femoral artery was obtained without difficulty. Over a 0.035 inch guidewire, a 5 French Pinnacle sheath was inserted. Through this, and also over 0.035 inch guidewire, a 5 [place] JB 1 catheter was advanced to the aortic arch region and selectively positioned in the right common carotid artery, the right vertebral artery, the left common carotid artery and the left subclavian artery. FINDINGS: The right vertebral artery origin is widely patent. The vessel is seen to ascend normally to the cranial skull base. There is moderate tortuosity at the level of C1-C2. The right vertebrobasilar junction proximal to the right posterior-inferior cerebellar artery is normally patent. The right posterior-inferior cerebellar artery is also unremarkable. Distal to the right posterior-inferior cerebellar artery there is a fusiform dilatation leading to a more focal fusiform irregular dilatation measuring approximately 11.8 mm x 11 mm. Distal to this there is fusiform dilatation into the basilar artery extending to the level of the origins of the posterior cerebral arteries. A diminutive superior cerebellar arteries and anterior inferior cerebellar arteries are identified. Inflow phenomenon is noted in the right vertebrobasilar junction from the contralateral vertebral artery. Unopacified blood is also noted in the diffusely  dilated tortuous basilar artery. The right common carotid arteriogram demonstrates the right external carotid artery and its major branches to be widely patent. The right internal carotid artery at the bulb to the cranial skull base opacifies normally. There is mild diffuse prominence of the distal petrous, cavernous and the supraclinoid segments. The right middle cerebral artery and the right anterior cerebral artery opacify into the capillary and venous phases. The left common carotid arteriogram demonstrates the left external carotid artery and its distal branches to be normally opacified. The left internal carotid artery at the bulb to the cranial skull base opacifies widely. The petrous, cavernous and supraclinoid segments are also mildly prominent. The left middle cerebral artery and the left anterior cerebral artery opacify into the capillary and venous phases. Cross filling via the anterior communicating right anterior cerebral artery A2 segment and distally is noted. The origin of the slightly dominant left vertebral artery is moderately tortuous. The vessel is, otherwise, seen to opacify without any stenosis to the cranial skull base. Wide patency is seen of the left vertebrobasilar junction proximal to the left posterior-inferior cerebellar artery. Distal to this there is diffuse prominence and fusiform dilatation of the left vertebrobasilar junction which extends into the confluence into the basilar artery. Again demonstrated is that large lobulated fusiform dilatation at the junction of the two vertebrobasilar junctions as previously mentioned measuring approximately 11 mm x 11 mm. Distal to this the diffusely fusiform dilatation of the basilar artery is noted extending to but not  involving the posterior cerebral arteries or the superior cerebellar arteries. Unopacified blood is seen in the basilar artery from the contralateral vertebral artery. 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 Diffuse prominence with fusiform dilatation and moderate tortuosity of the vertebrobasilar junction, and the basilar artery without involvement of the posterior cerebral arteries. Mild diffuse prominence of the internal carotid arteries and the cavernous and supraclinoid segments bilaterally slightly more so on the right side. No angiographic evidence of intimal flap identified in the vertebrobasilar junctions on the exam at this time. PLAN: Follow-up in approximately 3 months to 4 months following CT angiogram of the head and neck. Electronically Signed   By: Luanne Bras M.D.   On: 11/01/2017 10:08   Ir Angio Vertebral Sel Subclavian Innominate Uni L Mod Sed  Result Date: 11/02/2017 CLINICAL DATA:  Acute onset of left-sided weakness, blurred vision, diplopia and dysarthria. Abnormal CT angiogram of the head and neck. EXAM: IR BILATERAL ANGIO VERTERBRAL SELECTIVE SUBCALVIAN INNOMNATE WITH MODERATE SEDATION; BILATERAL COMMON CAROTID AND INNOMINATE ANGIOGRAPHY COMPARISON:  Angiogram CT angiogram of the head and neck 10/31/2017 and MRI of the brain 10/31/2017. MEDICATIONS: Heparin 1000 units IV; no antibiotic was administered within 1 hour of the procedure. ANESTHESIA/SEDATION: Mac anesthesia as per the Department of Anesthesiology at [place]:  Isovue 300 approximately 65 mL. FLUOROSCOPY TIME:  Fluoroscopy Time: 50 minutes 24 seconds (2396 mGy). COMPLICATIONS: None immediate. TECHNIQUE: Informed written consent was obtained from the patient after a thorough discussion of the procedural risks, benefits and alternatives. All questions were addressed. Maximal Sterile Barrier Technique was utilized including caps, mask, sterile gowns, sterile gloves, sterile drape, hand hygiene and skin antiseptic. A timeout was performed prior to the initiation of the procedure. The right groin was  prepped and draped in the usual sterile fashion. Thereafter using modified Seldinger technique, transfemoral access into the right common femoral artery was obtained without difficulty. Over a 0.035 inch guidewire, a 5 French Pinnacle sheath was inserted. Through this, and also over 0.035 inch guidewire, a 5 [place] JB 1 catheter was advanced to the aortic arch region and selectively positioned in the right common carotid artery, the right vertebral artery, the left common carotid artery and the left subclavian artery. FINDINGS: The right vertebral artery origin is widely patent. The vessel is seen to ascend normally to the cranial skull base. There is moderate tortuosity at the level of C1-C2. The right vertebrobasilar junction proximal to the right posterior-inferior cerebellar artery is normally patent. The right posterior-inferior cerebellar artery is also unremarkable. Distal to the right posterior-inferior cerebellar artery there is a fusiform dilatation leading to a more focal fusiform irregular dilatation measuring approximately 11.8 mm x 11 mm. Distal to this there is fusiform dilatation into the basilar artery extending to the level of the origins of the posterior cerebral arteries. A diminutive superior cerebellar arteries and anterior inferior cerebellar arteries are identified. Inflow phenomenon is noted in the right vertebrobasilar junction from the contralateral vertebral artery. Unopacified blood is also noted in the diffusely dilated tortuous basilar artery. The right common carotid arteriogram demonstrates the right external carotid artery and its major branches to be widely patent. The right internal carotid artery at the bulb to the cranial skull base opacifies normally. There is mild diffuse prominence of the distal petrous, cavernous and the supraclinoid segments. The right middle cerebral artery and the right anterior cerebral artery  opacify into the capillary and venous phases. The left common  carotid arteriogram demonstrates the left external carotid artery and its distal branches to be normally opacified. The left internal carotid artery at the bulb to the cranial skull base opacifies widely. The petrous, cavernous and supraclinoid segments are also mildly prominent. The left middle cerebral artery and the left anterior cerebral artery opacify into the capillary and venous phases. Cross filling via the anterior communicating right anterior cerebral artery A2 segment and distally is noted. The origin of the slightly dominant left vertebral artery is moderately tortuous. The vessel is, otherwise, seen to opacify without any stenosis to the cranial skull base. Wide patency is seen of the left vertebrobasilar junction proximal to the left posterior-inferior cerebellar artery. Distal to this there is diffuse prominence and fusiform dilatation of the left vertebrobasilar junction which extends into the confluence into the basilar artery. Again demonstrated is that large lobulated fusiform dilatation at the junction of the two vertebrobasilar junctions as previously mentioned measuring approximately 11 mm x 11 mm. Distal to this the diffusely fusiform dilatation of the basilar artery is noted extending to but not involving the posterior cerebral arteries or the superior cerebellar arteries. Unopacified blood is seen in the basilar artery from the contralateral vertebral artery. 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 Diffuse prominence with fusiform dilatation and moderate tortuosity of the vertebrobasilar junction, and the basilar artery without involvement of the posterior cerebral arteries. Mild diffuse prominence of the internal carotid arteries and the cavernous and supraclinoid segments bilaterally slightly more so on the right side. No angiographic evidence of intimal flap  identified in the vertebrobasilar junctions on the exam at this time. PLAN: Follow-up in approximately 3 months to 4 months following CT angiogram of the head and neck. Electronically Signed   By: Luanne Bras M.D.   On: 11/01/2017 10:08   Ir Angio Vertebral Sel Vertebral Uni R Mod Sed  Result Date: 11/02/2017 CLINICAL DATA:  Acute onset of left-sided weakness, blurred vision, diplopia and dysarthria. Abnormal CT angiogram of the head and neck. EXAM: IR BILATERAL ANGIO VERTERBRAL SELECTIVE SUBCALVIAN INNOMNATE WITH MODERATE SEDATION; BILATERAL COMMON CAROTID AND INNOMINATE ANGIOGRAPHY COMPARISON:  Angiogram CT angiogram of the head and neck 10/31/2017 and MRI of the brain 10/31/2017. MEDICATIONS: Heparin 1000 units IV; no antibiotic was administered within 1 hour of the procedure. ANESTHESIA/SEDATION: Mac anesthesia as per the Department of Anesthesiology at [facility]:  Isovue 300 approximately 65 mL. FLUOROSCOPY TIME:  Fluoroscopy Time: 50 minutes 24 seconds (2396 mGy). COMPLICATIONS: None immediate. TECHNIQUE: Informed written consent was obtained from the patient after a thorough discussion of the procedural risks, benefits and alternatives. All questions were addressed. Maximal Sterile Barrier Technique was utilized including caps, mask, sterile gowns, sterile gloves, sterile drape, hand hygiene and skin antiseptic. A timeout was performed prior to the initiation of the procedure. The right groin was prepped and draped in the usual sterile fashion. Thereafter using modified Seldinger technique, transfemoral access into the right common femoral artery was obtained without difficulty. Over a 0.035 inch guidewire, a 5 French Pinnacle sheath was inserted. Through this, and also over 0.035 inch guidewire, a 5 [place] JB 1 catheter was advanced to the aortic arch region and selectively positioned in the right common carotid artery, the right vertebral artery, the left common carotid artery  and the left subclavian artery. FINDINGS: The right vertebral  artery origin is widely patent. The vessel is seen to ascend normally to the cranial skull base. There is moderate tortuosity at the level of C1-C2. The right vertebrobasilar junction proximal to the right posterior-inferior cerebellar artery is normally patent. The right posterior-inferior cerebellar artery is also unremarkable. Distal to the right posterior-inferior cerebellar artery there is a fusiform dilatation leading to a more focal fusiform irregular dilatation measuring approximately 11.8 mm x 11 mm. Distal to this there is fusiform dilatation into the basilar artery extending to the level of the origins of the posterior cerebral arteries. A diminutive superior cerebellar arteries and anterior inferior cerebellar arteries are identified. Inflow phenomenon is noted in the right vertebrobasilar junction from the contralateral vertebral artery. Unopacified blood is also noted in the diffusely dilated tortuous basilar artery. The right common carotid arteriogram demonstrates the right external carotid artery and its major branches to be widely patent. The right internal carotid artery at the bulb to the cranial skull base opacifies normally. There is mild diffuse prominence of the distal petrous, cavernous and the supraclinoid segments. The right middle cerebral artery and the right anterior cerebral artery opacify into the capillary and venous phases. The left common carotid arteriogram demonstrates the left external carotid artery and its distal branches to be normally opacified. The left internal carotid artery at the bulb to the cranial skull base opacifies widely. The petrous, cavernous and supraclinoid segments are also mildly prominent. The left middle cerebral artery and the left anterior cerebral artery opacify into the capillary and venous phases. Cross filling via the anterior communicating right anterior cerebral artery A2 segment and  distally is noted. The origin of the slightly dominant left vertebral artery is moderately tortuous. The vessel is, otherwise, seen to opacify without any stenosis to the cranial skull base. Wide patency is seen of the left vertebrobasilar junction proximal to the left posterior-inferior cerebellar artery. Distal to this there is diffuse prominence and fusiform dilatation of the left vertebrobasilar junction which extends into the confluence into the basilar artery. Again demonstrated is that large lobulated fusiform dilatation at the junction of the two vertebrobasilar junctions as previously mentioned measuring approximately 11 mm x 11 mm. Distal to this the diffusely fusiform dilatation of the basilar artery is noted extending to but not involving the posterior cerebral arteries or the superior cerebellar arteries. Unopacified blood is seen in the basilar artery from the contralateral vertebral artery. 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 Diffuse prominence with fusiform dilatation and moderate tortuosity of the vertebrobasilar junction, and the basilar artery without involvement of the posterior cerebral arteries. Mild diffuse prominence of the internal carotid arteries and the cavernous and supraclinoid segments bilaterally slightly more so on the right side. No angiographic evidence of intimal flap identified in the vertebrobasilar junctions on the exam at this time. PLAN: Follow-up in approximately 3 months to 4 months following CT angiogram of the head and neck. Electronically Signed   By: Luanne Bras M.D.   On: 11/01/2017 10:08       Medical Problem List and Plan: 1.  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  Continue aspirin and Plavix x3 months then aspirin  alone.  Continue to monitor fusiform aneurysm follow-up neurology services outpatient for question plan for second opinion  -admit to inpatient rehab 2.  DVT  Prophylaxis/Anticoagulation: SCDs. 3. Pain Management: Tylenol as needed 4. Mood: Provide emotional support 5. Neuropsych: This patient is capable of making decisions on his own behalf. 6. Skin/Wound Care: Routine skin checks 7. Fluids/Electrolytes/Nutrition: Routine Ins and O's with follow-up chemistries upon admit. 8.  Hypertension.  Norvasc 2.5 mg daily.  Monitor with increased mobility and adjust regimen accordingly.  9.  Hyperlipidemia.  Lipitor 10.  Urine drug screen positive marijuana.  Counseling 11.  Obesity.  BMI 33.15.  Dietary follow-up   Post Admission Physician Evaluation: 1. Functional deficits secondary  to right pontine infarct. 2. Patient is admitted to receive collaborative, interdisciplinary care between the physiatrist, rehab nursing staff, and therapy team. 3. Patient's level of medical complexity and substantial therapy needs in context of that medical necessity cannot be provided at a lesser intensity of care such as a SNF. 4. Patient has experienced substantial functional loss from his/her baseline which was documented above under the "Functional History" and "Functional Status" headings.  Judging by the patient's diagnosis, physical exam, and functional history, the patient has potential for functional progress which will result in measurable gains while on inpatient rehab.  These gains will be of substantial and practical use upon discharge  in facilitating mobility and self-care at the household level. 5. Physiatrist will provide 24 hour management of medical needs as well as oversight of the therapy plan/treatment and provide guidance as appropriate regarding the interaction of the two. 6. The Preadmission Screening has been reviewed and patient status is unchanged unless otherwise stated above. 7. 24 hour rehab  nursing will assist with bladder management, bowel management, safety, skin/wound care, disease management, medication administration, pain management and patient education  and help integrate therapy concepts, techniques,education, etc. 8. PT will assess and treat for/with: Lower extremity strength, range of motion, stamina, balance, functional mobility, safety, adaptive techniques and equipment, NMR, community reentry.   Goals are: mod I. 9. OT will assess and treat for/with: ADL's, functional mobility, safety, upper extremity strength, adaptive techniques and equipment, NMR, community reentry, ego support.   Goals are: mod I. Therapy may proceed with showering this patient. 10. SLP will assess and treat for/with: speech, communication.  Goals are: mod I. 11. Case Management and Social Worker will assess and treat for psychological issues and discharge planning. 12. Team conference will be held weekly to assess progress toward goals and to determine barriers to discharge. 13. Patient will receive at least 3 hours of therapy per day at least 5 days per week. 14. ELOS: 15-21 days       15. Prognosis:  excellent   I have personally performed a face to face diagnostic evaluation of this patient and formulated the key components of the plan.  Additionally, I have personally reviewed laboratory data, imaging studies, as well as relevant notes and concur with the physician assistant's documentation above.  Meredith Staggers, MD, [facility]     Lavon Paganini Hamlin, PA-C 11/02/2017

## 2017-11-02 NOTE — Consult Note (Signed)
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 greater) or occlusion. Skeleton: Mild cervical spondylosis with multilevel disc and facet degenerative changes. No high-grade bony canal stenosis. Other neck: Negative. Upper chest: Negative. Review of the MIP images confirms the above findings CTA HEAD FINDINGS Anterior circulation: No significant stenosis, proximal occlusion, aneurysm, or vascular malformation. 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 Right eccentric high attenuation within the right aspect of the mid basilar artery does not opacify with contrast and there is no dissection flap compatible with intramural hematoma. Bilateral vertebral arteries, bilateral PICA, bilateral SCA, and bilateral PCA are patent without significant stenosis, aneurysm, or occlusion. AICA are not identified, possibly hypoplastic or absent. Venous sinuses: As permitted by contrast timing, patent. Anatomic variants: Patent anterior communicating artery. No posterior communicating artery identified, likely hypoplastic or absent. Delayed phase: No abnormal intracranial enhancement. Review of the MIP images confirms the above findings CT Brain Perfusion Findings: CBF  (<30%) Volume: 4mL Perfusion (Tmax>6.0s) volume: 88mL Mismatch Volume: 84mL Infarction Location:Core infarct, CBF (<30%), in the left occipital lobe PCA distribution. Ischemic penumbra, perfusion (Tmax>6.0s), throughout the cerebellum, left-greater-than-right occipital lobes, and left posterior thalamus. Additionally there is increased T-max >4.0s throughout the entire posterior circulation. IMPRESSION: 1. No acute abnormality on noncontrast CT of head.  Aspects is 10. 2. 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 3. Eccentric hyperdense wall thickening and luminal narrowing within the proximal to mid basilar artery that does not enhance on the angiogram. Findings probably represent an intramural hematoma or possibly dissection although no dissection flap is identified. 4. Otherwise no large vessel occlusion, high-grade stenosis, or aneurysm of the anterior posterior intracranial circulation. 5. No significant stenosis of the carotid or vertebral arteries by NASCET criteria. 6. Perfusion anomaly throughout the posterior circulation with mildly increased T-max. RAPID calculated core infarct of 4 cc in the left occipital lobe and RAPID calculated ischemic penumbra of 84 cc in the left-greater-than-right PCA distributions as well as cerebellum. 7. Chronic left parietal and bilateral frontal orbital areas of encephalomalacia. Stable background of chronic microvascular ischemic changes of white matter and parenchymal volume loss of the brain. 8. Extensive paranasal sinus disease. These results were called by telephone at the time of interpretation on 10/31/2017 at 8:21 pm to Dr. Wilford Corner, who verbally acknowledged these results. Electronically Signed   By: Mitzi Hansen M.D.   On: 10/31/2017 20:47   Dg Chest Port 1 View  Result Date: 11/01/2017 CLINICAL DATA:  Stroke EXAM: PORTABLE CHEST 1 VIEW COMPARISON:  06/14/2014 FINDINGS: Mild elevation of the right diaphragm. No acute airspace  disease or pleural effusion. Cardiomediastinal silhouette within normal limits. No pneumothorax. Old left third rib fracture. IMPRESSION: No active disease. Electronically Signed   By: Jasmine Pang M.D.   On: 11/01/2017 01:58   Ct Head Code Stroke Wo Contrast`  Result Date: 10/31/2017 CLINICAL DATA:  Code stroke.  52 y/o  M; left-sided deficits. EXAM: CT OF THE HEAD WITHOUT CONTRAST CT ANGIOGRAPHY HEAD AND NECK CT PERFUSION BRAIN TECHNIQUE: CT of the head from skull base to vertex was acquired without intravenous contrast with coronal and sagittal reconstructions. Multidetector CT imaging of the head and neck was performed using the  standard protocol during bolus administration of intravenous contrast. Multiplanar CT image reconstructions and MIPs were obtained to evaluate the vascular anatomy. Carotid stenosis measurements (when applicable) are obtained utilizing NASCET criteria, using the distal internal carotid diameter as the denominator. Multiphase CT imaging of the brain was performed following IV bolus contrast injection. Subsequent parametric perfusion maps were calculated using RAPID software. CONTRAST:  ISOVUE-370 IOPAMIDOL (ISOVUE-370) INJECTION 76% COMPARISON:  06/14/2014 and 03/17/2009 CT of the head. FINDINGS: CT HEAD FINDINGS Brain: Stable chronic encephalomalacia in the left frontoparietal junction and within the right greater than left frontal orbital cortices. No large acute stroke identified. No hemorrhage or mass effect. No large supratentorial vascular territory acute stroke. Mild chronic microvascular ischemic changes and parenchymal volume loss of the brain are stable. Vascular: Right eccentric high attenuation within the wall of aneurysmal basilar artery. Skull: Normal. Negative for fracture or focal lesion. Sinuses/Orbits: Extensive paranasal sinus disease with diffuse mucosal thickening and partial opacification of maxillary as well as ethmoid air cells. Normal aeration of  mastoid air cells. Bilateral intra-ocular lens replacement. Other: None. ASPECTS ([place] Stroke Program Early CT Score) - Ganglionic level infarction (caudate, lentiform nuclei, internal capsule, insula, M1-M3 cortex): 7 - Supraganglionic infarction (M4-M6 cortex): 3 Total score (0-10 with 10 being normal): 10 Review of the MIP images confirms the above findings CTA NECK FINDINGS Aortic arch: Standard branching. Imaged portion shows no evidence of aneurysm or dissection. No significant stenosis of the major arch vessel origins. Right carotid system: No evidence of dissection, stenosis (50% or greater) or occlusion. Left carotid system: No evidence of dissection, stenosis (50% or greater) or occlusion. Vertebral arteries: Codominant. No evidence of dissection, stenosis (50% or greater) or occlusion. Skeleton: Mild cervical spondylosis with multilevel disc and facet degenerative changes. No high-grade bony canal stenosis. Other neck: Negative. Upper chest: Negative. Review of the MIP images confirms the above findings CTA HEAD FINDINGS Anterior circulation: No significant stenosis, proximal occlusion, aneurysm, or vascular malformation. 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 Right eccentric high attenuation within the right aspect of the mid basilar artery does not opacify with contrast and there is no dissection flap compatible with intramural hematoma. Bilateral vertebral arteries, bilateral PICA, bilateral SCA, and bilateral PCA are patent without significant stenosis, aneurysm, or occlusion. AICA are not identified, possibly hypoplastic or absent. Venous sinuses: As permitted by contrast timing, patent. Anatomic variants: Patent anterior communicating artery. No posterior communicating artery identified, likely hypoplastic or absent. Delayed phase: No abnormal intracranial enhancement. Review of the MIP images confirms the above findings CT Brain Perfusion Findings: CBF  (<30%) Volume: 4mL Perfusion (Tmax>6.0s) volume: 88mL Mismatch Volume: 84mL Infarction Location:Core infarct, CBF (<30%), in the left occipital lobe PCA distribution. Ischemic penumbra, perfusion (Tmax>6.0s), throughout the cerebellum, left-greater-than-right occipital lobes, and left posterior thalamus. Additionally there is increased T-max >4.0s throughout the entire posterior circulation. IMPRESSION: 1. No acute abnormality on noncontrast CT of head.  Aspects is 10. 2. 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 3. Eccentric hyperdense wall thickening and luminal narrowing within the proximal to mid basilar artery that does not enhance on the angiogram. Findings probably represent an intramural hematoma or possibly dissection although no dissection flap is identified. 4. Otherwise no large vessel occlusion, high-grade stenosis, or aneurysm of the anterior posterior intracranial circulation. 5. No significant stenosis of the carotid or vertebral arteries by NASCET criteria. 6. Perfusion anomaly throughout the posterior circulation with mildly increased T-max. RAPID calculated core  infarct of 4 cc in the left occipital lobe and RAPID calculated ischemic penumbra of 84 cc in the left-greater-than-right PCA distributions as well as cerebellum. 7. Chronic left parietal and bilateral frontal orbital areas of encephalomalacia. Stable background of chronic microvascular ischemic changes of white matter and parenchymal volume loss of the brain. 8. Extensive paranasal sinus disease. These results were called by telephone at the time of interpretation on 10/31/2017 at 8:21 pm to Dr. Wilford Corner, who verbally acknowledged these results. Electronically Signed   By: Mitzi Hansen M.D.   On: 10/31/2017 20:47     Assessment/Plan: Diagnosis: right pontine infarct with left hemiparesis 1. Does the need for close, 24 hr/day medical supervision in concert with the patient's rehab needs make it  unreasonable for this patient to be served in a less intensive setting? Yes 2. Co-Morbidities requiring supervision/potential complications: hx of TBI 3. Due to bladder management, bowel management, safety, skin/wound care, disease management, medication administration, pain management and patient education, does the patient require 24 hr/day rehab nursing? Yes 4. Does the patient require coordinated care of a physician, rehab nurse, PT (1-2 hrs/day, 5 days/week), OT (1-2 hrs/day, 5 days/week) and SLP (1-2 hrs/day, 5 days/week) to address physical and functional deficits in the context of the above medical diagnosis(es)? Yes Addressing deficits in the following areas: balance, endurance, locomotion, strength, transferring, bowel/bladder control, bathing, dressing, feeding, grooming, toileting, speech and psychosocial support 5. Can the patient actively participate in an intensive therapy program of at least 3 hrs of therapy per day at least 5 days per week? Yes 6. The potential for patient to make measurable gains while on inpatient rehab is excellent 7. Anticipated functional outcomes upon discharge from inpatient rehab are modified independent  with PT, modified independent with OT, independent with SLP. 8. Estimated rehab length of stay to reach the above functional goals is: 14-20 days 9. Anticipated D/C setting: Home 10. Anticipated post D/C treatments: HH therapy and Outpatient therapy 11. Overall Rehab/Functional Prognosis: excellent  RECOMMENDATIONS: This patient's condition is appropriate for continued rehabilitative care in the following setting: CIR Patient has agreed to participate in recommended program. Yes Note that insurance prior authorization may be required for reimbursement for recommended care.  Comment: Rehab Admissions Coordinator to follow up.  Thanks,  Ranelle Oyster, MD, [place] [facility]  I have personally performed a face to face diagnostic evaluation of this patient.  Additionally, I have reviewed and concur with the physician assistant's documentation above.     Mcarthur Rossetti Angiulli, PA-C 11/02/2017
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
## 2017-11-02 NOTE — Progress Notes (Signed)
I met with patient and his wife at bedside to begin discussions of goals and expectations of an inpt rehab admit. I will begin insurance authorization with [employer] for admit to inpt rehab pending insurance approval and medical clearance for admit to CIR. I will follow up on Monday. [phone]

## 2017-11-02 NOTE — Progress Notes (Signed)
  Echocardiogram 2D Echocardiogram has been performed.  Tye Savoy 11/02/2017, 9:18 AM

## 2017-11-02 NOTE — Progress Notes (Signed)
Physical Therapy Treatment Patient Details Name: Justin Cooke MRN: [id number] DOB: 08-23-1965 Today's Date: 11/02/2017    History of Present Illness 52 yo male with hx of TBI now admitted s/p R Pons / L Pons/ Midbrain infarct with L hemiplegia diplopia and dysarthria    PT Comments    Pt very pleasant and joking throughout session. Pt with improved sitting and standing balance with cues and assist for weight shift, sequencing and transfers. Pt aware of how to assist left side with right side and utilizes appropriately for bed mobility and positioning although cues need for attention to placement of LUE. Pt pleased with progression in function this session and wife present and educated throughout.     Follow Up Recommendations  CIR;Supervision/Assistance - 24 hour     Equipment Recommendations  Wheelchair (measurements PT);3in1 (PT);Wheelchair cushion (measurements PT)    Recommendations for Other Services Rehab consult     Precautions / Restrictions Precautions Precautions: Fall Precaution Comments: seizure    Mobility  Bed Mobility Overal bed mobility: Needs Assistance Bed Mobility: Supine to Sit     Supine to sit: HOB elevated;Min assist     General bed mobility comments: pt able to move LLE off the bed with assist of RLE, pt attempting to sit up from supine with ability to clear shoulders but not achieve sitting. Pt cued for increased rotation and scooting right hip forward, pressing through RUE with pt able to achieve sitting with min assist.   Transfers Overall transfer level: Needs assistance   Transfers: Sit to/from Stand;Squat Pivot Transfers Sit to Stand: +2 physical assistance;Min assist   Squat pivot transfers: +2 physical assistance;Mod assist     General transfer comment: pt stood x 3 from bed with cues for positioning, sequence and safety. X 2 with therapists in front of pt with cues for weight shifting and balance. 3rd trial with therapists beside in 3  musketeer fashion. Pt able to stand and weight shift with cues grossly 3 min longest trial in standing  Ambulation/Gait             General Gait Details: unable   Stairs             Wheelchair Mobility    Modified Rankin (Stroke Patients Only) Modified Rankin (Stroke Patients Only) Pre-Morbid Rankin Score: No significant disability Modified Rankin: Severe disability     Balance Overall balance assessment: Needs assistance Sitting-balance support: Single extremity supported;Feet supported Sitting balance-Leahy Scale: Fair Sitting balance - Comments: pt with left lean able to prop on LUE x 5 and return to midline with increasing ability to achieve midline with progressive trials   Standing balance support: Bilateral upper extremity supported Standing balance-Leahy Scale: Poor Standing balance comment: during standing left knee blocked with assist for hip extension and lateral weight shift of hips toward Right as well as midline posture and extension                            Cognition Arousal/Alertness: Awake/alert Behavior During Therapy: WFL for tasks assessed/performed Overall Cognitive Status: History of cognitive impairments - at baseline                                 General Comments: pt likes to joke and following all commands well, decreased attention      Exercises      General Comments  Pertinent Vitals/Pain Pain Assessment: No/denies pain    Home Living                      Prior Function            PT Goals (current goals can now be found in the care plan section) Progress towards PT goals: Progressing toward goals    Frequency           PT Plan Current plan remains appropriate    Co-evaluation              AM-PAC PT "6 Clicks" Daily Activity  Outcome Measure  Difficulty turning over in bed (including adjusting bedclothes, sheets and blankets)?: Unable Difficulty moving from  lying on back to sitting on the side of the bed? : Unable Difficulty sitting down on and standing up from a chair with arms (e.g., wheelchair, bedside commode, etc,.)?: Unable Help needed moving to and from a bed to chair (including a wheelchair)?: A Lot Help needed walking in hospital room?: Total Help needed climbing 3-5 steps with a railing? : Total 6 Click Score: 7    End of Session Equipment Utilized During Treatment: Gait belt Activity Tolerance: Patient tolerated treatment well Patient left: in chair;with family/visitor present;with chair alarm set;with nursing/sitter in room Nurse Communication: Mobility status;Other (comment)(nursing educated for squat pivot with 2 person assist to pt's right) PT Visit Diagnosis: Hemiplegia and hemiparesis;Other symptoms and signs involving the nervous system (R29.898) Hemiplegia - Right/Left: Left Hemiplegia - dominant/non-dominant: Dominant Hemiplegia - caused by: Cerebral infarction     Time: 1330-1403 PT Time Calculation (min) (ACUTE ONLY): 33 min  Charges:  $Therapeutic Activity: 8-22 mins $Neuromuscular Re-education: 8-22 mins                    G Codes:       Delaney Meigs, PT [phone]    Krystalynn Ridgeway B Tattiana Fakhouri 11/02/2017, 2:18 PM

## 2017-11-03 ENCOUNTER — Encounter (HOSPITAL_COMMUNITY): Payer: Self-pay | Admitting: Interventional Radiology

## 2017-11-03 ENCOUNTER — Inpatient Hospital Stay (HOSPITAL_COMMUNITY)
Admission: RE | Admit: 2017-11-03 | Discharge: 2017-12-02 | DRG: 057 | Disposition: E | Payer: PRIVATE HEALTH INSURANCE | Source: Intra-hospital | Attending: Physical Medicine & Rehabilitation | Admitting: Physical Medicine & Rehabilitation

## 2017-11-03 DIAGNOSIS — Z8249 Family history of ischemic heart disease and other diseases of the circulatory system: Secondary | ICD-10-CM | POA: Diagnosis not present

## 2017-11-03 DIAGNOSIS — G8194 Hemiplegia, unspecified affecting left nondominant side: Secondary | ICD-10-CM

## 2017-11-03 DIAGNOSIS — I10 Essential (primary) hypertension: Secondary | ICD-10-CM

## 2017-11-03 DIAGNOSIS — E785 Hyperlipidemia, unspecified: Secondary | ICD-10-CM | POA: Diagnosis present

## 2017-11-03 DIAGNOSIS — I69322 Dysarthria following cerebral infarction: Secondary | ICD-10-CM

## 2017-11-03 DIAGNOSIS — Z87891 Personal history of nicotine dependence: Secondary | ICD-10-CM | POA: Diagnosis not present

## 2017-11-03 DIAGNOSIS — G479 Sleep disorder, unspecified: Secondary | ICD-10-CM

## 2017-11-03 DIAGNOSIS — I6350 Cerebral infarction due to unspecified occlusion or stenosis of unspecified cerebral artery: Secondary | ICD-10-CM | POA: Diagnosis not present

## 2017-11-03 DIAGNOSIS — K219 Gastro-esophageal reflux disease without esophagitis: Secondary | ICD-10-CM | POA: Diagnosis present

## 2017-11-03 DIAGNOSIS — I671 Cerebral aneurysm, nonruptured: Secondary | ICD-10-CM | POA: Diagnosis present

## 2017-11-03 DIAGNOSIS — I469 Cardiac arrest, cause unspecified: Secondary | ICD-10-CM | POA: Diagnosis not present

## 2017-11-03 DIAGNOSIS — H532 Diplopia: Secondary | ICD-10-CM | POA: Diagnosis present

## 2017-11-03 DIAGNOSIS — Z87820 Personal history of traumatic brain injury: Secondary | ICD-10-CM | POA: Diagnosis not present

## 2017-11-03 DIAGNOSIS — R7303 Prediabetes: Secondary | ICD-10-CM | POA: Diagnosis present

## 2017-11-03 DIAGNOSIS — Z6833 Body mass index (BMI) 33.0-33.9, adult: Secondary | ICD-10-CM | POA: Diagnosis not present

## 2017-11-03 DIAGNOSIS — I6302 Cerebral infarction due to thrombosis of basilar artery: Secondary | ICD-10-CM

## 2017-11-03 DIAGNOSIS — Z833 Family history of diabetes mellitus: Secondary | ICD-10-CM | POA: Diagnosis not present

## 2017-11-03 DIAGNOSIS — G9389 Other specified disorders of brain: Secondary | ICD-10-CM | POA: Diagnosis present

## 2017-11-03 DIAGNOSIS — I69352 Hemiplegia and hemiparesis following cerebral infarction affecting left dominant side: Principal | ICD-10-CM

## 2017-11-03 DIAGNOSIS — D72829 Elevated white blood cell count, unspecified: Secondary | ICD-10-CM

## 2017-11-03 DIAGNOSIS — E669 Obesity, unspecified: Secondary | ICD-10-CM | POA: Diagnosis present

## 2017-11-03 MED ORDER — CLOPIDOGREL BISULFATE 75 MG PO TABS: 75.0000 mg | ORAL_TABLET | Freq: Every day | ORAL | 0 refills | Status: AC

## 2017-11-03 MED ORDER — ATORVASTATIN CALCIUM 80 MG PO TABS: 80.0000 mg | ORAL_TABLET | Freq: Every day | ORAL | 4 refills | Status: AC

## 2017-11-03 MED ORDER — SORBITOL 70 % SOLN
30.0000 mL | Freq: Every day | Status: DC | PRN
  Administered 2017-11-04: 30 mL via ORAL
  Filled 2017-11-03: qty 30

## 2017-11-03 MED ORDER — ASPIRIN 325 MG PO TABS: 325.0000 mg | ORAL_TABLET | Freq: Every day | ORAL | 12 refills | Status: AC

## 2017-11-03 MED ORDER — ACETAMINOPHEN 650 MG RE SUPP: 650.0000 mg | RECTAL | Status: DC | PRN

## 2017-11-03 MED ORDER — ASPIRIN 325 MG PO TABS
325.0000 mg | ORAL_TABLET | Freq: Every day | ORAL | Status: DC
  Administered 2017-11-04: 325 mg via ORAL
  Filled 2017-11-03: qty 1

## 2017-11-03 MED ORDER — AMLODIPINE BESYLATE 2.5 MG PO TABS: 2.5000 mg | ORAL_TABLET | Freq: Every day | ORAL | 3 refills | Status: AC

## 2017-11-03 MED ORDER — ATORVASTATIN CALCIUM 80 MG PO TABS
80.0000 mg | ORAL_TABLET | Freq: Every day | ORAL | Status: DC
  Administered 2017-11-03 – 2017-11-04 (×2): 80 mg via ORAL
  Filled 2017-11-03 (×2): qty 1

## 2017-11-03 MED ORDER — SENNOSIDES-DOCUSATE SODIUM 8.6-50 MG PO TABS: 1.0000 | ORAL_TABLET | Freq: Every evening | ORAL | Status: DC | PRN

## 2017-11-03 MED ORDER — ONDANSETRON HCL 4 MG PO TABS: 4.0000 mg | ORAL_TABLET | Freq: Four times a day (QID) | ORAL | Status: DC | PRN

## 2017-11-03 MED ORDER — ACETAMINOPHEN 325 MG PO TABS: 650.0000 mg | ORAL_TABLET | ORAL | 0 refills | Status: AC | PRN

## 2017-11-03 MED ORDER — ACETAMINOPHEN 325 MG PO TABS
650.0000 mg | ORAL_TABLET | ORAL | Status: DC | PRN
  Administered 2017-11-03 – 2017-11-04 (×5): 650 mg via ORAL
  Filled 2017-11-03 (×6): qty 2

## 2017-11-03 MED ORDER — ACETAMINOPHEN 160 MG/5ML PO SOLN: 650.0000 mg | ORAL | Status: DC | PRN

## 2017-11-03 MED ORDER — SENNOSIDES-DOCUSATE SODIUM 8.6-50 MG PO TABS: 1.0000 | ORAL_TABLET | Freq: Every evening | ORAL | 0 refills | Status: AC | PRN

## 2017-11-03 MED ORDER — CLOPIDOGREL BISULFATE 75 MG PO TABS
75.0000 mg | ORAL_TABLET | Freq: Every day | ORAL | Status: DC
  Administered 2017-11-04: 75 mg via ORAL
  Filled 2017-11-03: qty 1

## 2017-11-03 MED ORDER — ONDANSETRON HCL 4 MG/2ML IJ SOLN
4.0000 mg | Freq: Four times a day (QID) | INTRAMUSCULAR | Status: DC | PRN
Start: 2017-11-03 — End: 2017-11-05

## 2017-11-03 MED ORDER — AMLODIPINE BESYLATE 2.5 MG PO TABS
2.5000 mg | ORAL_TABLET | Freq: Every day | ORAL | Status: DC
  Administered 2017-11-04: 2.5 mg via ORAL
  Filled 2017-11-03: qty 1

## 2017-11-03 NOTE — Progress Notes (Signed)
Physical Therapy Treatment Patient Details Name: Justin Cooke MRN: [id number] DOB: 11/18/1965 Today's Date: Nov 24, 2017    History of Present Illness 52 yo male with hx of TBI now admitted s/p R Pons / L Pons/ Midbrain infarct with L hemiplegia diplopia and dysarthria    PT Comments    Patient is pleasant and eager to participate in therapy. Wife present and supportive. Pt required mod/max A +2 for functional transfers. Continue to recommend CIR for further skilled PT services to maximize independence and safety with mobility.   Follow Up Recommendations  CIR;Supervision/Assistance - 24 hour     Equipment Recommendations  Wheelchair (measurements PT);3in1 (PT);Wheelchair cushion (measurements PT)    Recommendations for Other Services Rehab consult     Precautions / Restrictions Precautions Precautions: Fall Precaution Comments: seizure Restrictions Weight Bearing Restrictions: No    Mobility  Bed Mobility Overal bed mobility: Needs Assistance Bed Mobility: Supine to Sit     Supine to sit: Min assist;HOB elevated     General bed mobility comments: cues for sequencing and attention to L UE positioning; assist to elevate trunk into sitting  Transfers Overall transfer level: Needs assistance Equipment used: Rolling walker (2 wheeled);2 person hand held assist Transfers: Sit to/from [employer] Sit to Stand: Mod assist;+2 physical assistance;From elevated surface Stand pivot transfers: Max assist;+2 physical assistance       General transfer comment: pt stood X2 from EOB and stand pivot EOB to recliner; inital stand with RW; L knee blocked throughout transfers as pt is unable to accept weight on L LE without knee buckling; assist for balance, weight shifting, and pivoting L LE  Ambulation/Gait                 Stairs             Wheelchair Mobility    Modified Rankin (Stroke Patients Only)       Balance Overall balance assessment:  Needs assistance   Sitting balance-Leahy Scale: Fair       Standing balance-Leahy Scale: Poor                              Cognition Arousal/Alertness: Awake/alert Behavior During Therapy: WFL for tasks assessed/performed Overall Cognitive Status: History of cognitive impairments - at baseline                                 General Comments: pt likes to joke and following all commands well; easily distracted      Exercises General Exercises - Upper Extremity Shoulder Flexion: AAROM;Left;5 reps;Seated Elbow Flexion: PROM;Left;5 reps;Seated Elbow Extension: PROM;Left;5 reps;Seated;AAROM Wrist Flexion: PROM;Left;5 reps;Seated;AAROM Wrist Extension: PROM;AAROM;Left;5 reps;Seated    General Comments General comments (skin integrity, edema, etc.): wife present      Pertinent Vitals/Pain Pain Assessment: Faces Faces Pain Scale: No hurt Pain Location: head Pain Descriptors / Indicators: Headache Pain Intervention(s): Monitored during session    Home Living     Available Help at Discharge: (wife plans to take FMLA as needed)   Home Access: Level entry   Home Layout: One level   Additional Comments: attends monthly BI support groups at [facility]    Prior Function            PT Goals (current goals can now be found in the care plan section) Acute Rehab PT Goals Patient Stated Goal: to use L  hand Progress towards PT goals: Progressing toward goals    Frequency    Min 4X/week      PT Plan Current plan remains appropriate    Co-evaluation              AM-PAC PT "6 Clicks" Daily Activity  Outcome Measure  Difficulty turning over in bed (including adjusting bedclothes, sheets and blankets)?: Unable Difficulty moving from lying on back to sitting on the side of the bed? : Unable Difficulty sitting down on and standing up from a chair with arms (e.g., wheelchair, bedside commode, etc,.)?: Unable Help needed moving to and from a bed  to chair (including a wheelchair)?: A Lot Help needed walking in hospital room?: Total Help needed climbing 3-5 steps with a railing? : Total 6 Click Score: 7    End of Session Equipment Utilized During Treatment: Gait belt Activity Tolerance: Patient tolerated treatment well Patient left: in chair;with family/visitor present;with call bell/phone within reach Nurse Communication: Mobility status PT Visit Diagnosis: Hemiplegia and hemiparesis;Other symptoms and signs involving the nervous system (R29.898) Hemiplegia - Right/Left: Left Hemiplegia - dominant/non-dominant: Dominant Hemiplegia - caused by: Cerebral infarction     Time: 1610-9604 PT Time Calculation (min) (ACUTE ONLY): 24 min  Charges:  $Therapeutic Activity: 23-37 mins                    G Codes:       Erline Levine, PTA Pager: [phone]     Carolynne Edouard 11/19/17, 1:37 PM

## 2017-11-03 NOTE — Progress Notes (Signed)
I have insurance approval and bed available to admit pt to CIR today. I met with patient and his wife at bedside and they are in agreement. I contacted NP for Dr. Leonie Man and I will make the arrangements to admit today. [place] CM and SW made aware. [phone]

## 2017-11-03 NOTE — Progress Notes (Signed)
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 being normal): 10 Review of the MIP images confirms the above findings CTA NECK FINDINGS Aortic arch: Standard branching. Imaged portion shows no evidence of aneurysm or dissection. No significant stenosis of the major arch vessel origins. Right carotid system: No evidence of dissection, stenosis (50% or greater) or occlusion. Left carotid system: No evidence of dissection, stenosis (50% or greater) or occlusion. Vertebral arteries: Codominant. No evidence of dissection, stenosis (50% or greater) or occlusion. Skeleton: Mild cervical spondylosis with multilevel disc and facet degenerative changes. No high-grade bony canal stenosis. Other neck: Negative. Upper chest: Negative. Review of the MIP images confirms the above findings CTA HEAD FINDINGS Anterior circulation: No significant stenosis, proximal occlusion, aneurysm, or vascular malformation. 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 Right eccentric high attenuation within the right aspect of the mid basilar artery does not  opacify with contrast and there is no dissection flap compatible with intramural hematoma. Bilateral vertebral arteries, bilateral PICA, bilateral SCA, and bilateral PCA are patent without significant stenosis, aneurysm, or occlusion. AICA are not identified, possibly hypoplastic or absent. Venous sinuses: As permitted by contrast timing, patent. Anatomic variants: Patent anterior communicating artery. No posterior communicating artery identified, likely hypoplastic or absent. Delayed phase: No abnormal intracranial enhancement. Review of the MIP images confirms the above findings CT Brain Perfusion Findings: CBF (<30%) Volume: 4mL Perfusion (Tmax>6.0s) volume: 88mL Mismatch Volume: 84mL Infarction Location:Core infarct, CBF (<30%), in the left occipital lobe PCA distribution. Ischemic penumbra, perfusion (Tmax>6.0s), throughout the cerebellum, left-greater-than-right occipital lobes, and left posterior thalamus. Additionally there is increased T-max >4.0s throughout the entire posterior circulation. IMPRESSION: 1. No acute abnormality on noncontrast CT of head.  Aspects is 10. 2. 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 3. Eccentric hyperdense wall thickening and luminal narrowing within the proximal to mid basilar artery that does not enhance on the angiogram. Findings probably represent an intramural hematoma or possibly dissection although no dissection flap is identified. 4. Otherwise no large vessel occlusion, high-grade stenosis, or aneurysm of the anterior posterior intracranial circulation. 5. No significant stenosis of the carotid or vertebral arteries by NASCET criteria. 6. Perfusion anomaly throughout the posterior circulation with mildly increased T-max. RAPID calculated core infarct of 4 cc in the left occipital lobe and RAPID calculated ischemic penumbra of 84 cc in the left-greater-than-right PCA distributions as well as cerebellum. 7. Chronic left parietal and bilateral  frontal orbital areas of encephalomalacia. Stable background of chronic microvascular ischemic changes of white matter and parenchymal volume loss of the brain. 8. Extensive paranasal sinus disease. These results were called by telephone at the time of interpretation on 10/31/2017 at 8:21 pm to Dr. Wilford Corner, who verbally acknowledged these results. Electronically Signed   By: Mitzi Hansen M.D.   On: 10/31/2017 20:47   Dg Chest Port 1 View  Result Date: 11/01/2017 CLINICAL DATA:  Stroke EXAM:  PORTABLE CHEST 1 VIEW COMPARISON:  06/14/2014 FINDINGS: Mild elevation of the right diaphragm. No acute airspace disease or pleural effusion. Cardiomediastinal silhouette within normal limits. No pneumothorax. Old left third rib fracture. IMPRESSION: No active disease. Electronically Signed   By: Jasmine Pang M.D.   On: 11/01/2017 01:58   Ct Head Code Stroke Wo Contrast`  Result Date: 10/31/2017 CLINICAL DATA:  Code stroke.  52 y/o  M; left-sided deficits. EXAM: CT OF THE HEAD WITHOUT CONTRAST CT ANGIOGRAPHY HEAD AND NECK CT PERFUSION BRAIN TECHNIQUE: CT of the head from skull base to vertex was acquired without intravenous contrast with coronal and sagittal reconstructions. Multidetector CT imaging of the head and neck was performed using the standard protocol during bolus administration of intravenous contrast. Multiplanar CT image reconstructions and MIPs were obtained to evaluate the vascular anatomy. Carotid stenosis measurements (when applicable) are obtained utilizing NASCET criteria, using the distal internal carotid diameter as the denominator. Multiphase CT imaging of the brain was performed following IV bolus contrast injection. Subsequent parametric perfusion maps were calculated using RAPID software. CONTRAST:  ISOVUE-370 IOPAMIDOL (ISOVUE-370) INJECTION 76% COMPARISON:  06/14/2014 and 03/17/2009 CT of the head. FINDINGS: CT HEAD FINDINGS Brain: Stable chronic encephalomalacia in the left  frontoparietal junction and within the right greater than left frontal orbital cortices. No large acute stroke identified. No hemorrhage or mass effect. No large supratentorial vascular territory acute stroke. Mild chronic microvascular ischemic changes and parenchymal volume loss of the brain are stable. Vascular: Right eccentric high attenuation within the wall of aneurysmal basilar artery. Skull: Normal. Negative for fracture or focal lesion. Sinuses/Orbits: Extensive paranasal sinus disease with diffuse mucosal thickening and partial opacification of maxillary as well as ethmoid air cells. Normal aeration of mastoid air cells. Bilateral intra-ocular lens replacement. Other: None. ASPECTS (Alberta Stroke Program Early CT Score) - Ganglionic level infarction (caudate, lentiform nuclei, internal capsule, insula, M1-M3 cortex): 7 - Supraganglionic infarction (M4-M6 cortex): 3 Total score (0-10 with 10 being normal): 10 Review of the MIP images confirms the above findings CTA NECK FINDINGS Aortic arch: Standard branching. Imaged portion shows no evidence of aneurysm or dissection. No significant stenosis of the major arch vessel origins. Right carotid system: No evidence of dissection, stenosis (50% or greater) or occlusion. Left carotid system: No evidence of dissection, stenosis (50% or greater) or occlusion. Vertebral arteries: Codominant. No evidence of dissection, stenosis (50% or greater) or occlusion. Skeleton: Mild cervical spondylosis with multilevel disc and facet degenerative changes. No high-grade bony canal stenosis. Other neck: Negative. Upper chest: Negative. Review of the MIP images confirms the above findings CTA HEAD FINDINGS Anterior circulation: No significant stenosis, proximal occlusion, aneurysm, or vascular malformation. 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 Right eccentric high attenuation within the right aspect of the mid basilar artery does not  opacify with contrast and there is no dissection flap compatible with intramural hematoma. Bilateral vertebral arteries, bilateral PICA, bilateral SCA, and bilateral PCA are patent without significant stenosis, aneurysm, or occlusion. AICA are not identified, possibly hypoplastic or absent. Venous sinuses: As permitted by contrast timing, patent. Anatomic variants: Patent anterior communicating artery. No posterior communicating artery identified, likely hypoplastic or absent. Delayed phase: No abnormal intracranial enhancement. Review of the MIP images confirms the above findings CT Brain Perfusion Findings: CBF (<30%) Volume: 4mL Perfusion (Tmax>6.0s) volume: 88mL Mismatch Volume: 84mL Infarction Location:Core infarct, CBF (<30%), in the left occipital lobe PCA distribution. Ischemic penumbra, perfusion (Tmax>6.0s), throughout the cerebellum, left-greater-than-right  occipital lobes, and left posterior thalamus. Additionally there is increased T-max >4.0s throughout the entire posterior circulation. IMPRESSION: 1. No acute abnormality on noncontrast CT of head.  Aspects is 10. 2. 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 3. Eccentric hyperdense wall thickening and luminal narrowing within the proximal to mid basilar artery that does not enhance on the angiogram. Findings probably represent an intramural hematoma or possibly dissection although no dissection flap is identified. 4. Otherwise no large vessel occlusion, high-grade stenosis, or aneurysm of the anterior posterior intracranial circulation. 5. No significant stenosis of the carotid or vertebral arteries by NASCET criteria. 6. Perfusion anomaly throughout the posterior circulation with mildly increased T-max. RAPID calculated core infarct of 4 cc in the left occipital lobe and RAPID calculated ischemic penumbra of 84 cc in the left-greater-than-right PCA distributions as well as cerebellum. 7. Chronic left parietal and bilateral  frontal orbital areas of encephalomalacia. Stable background of chronic microvascular ischemic changes of white matter and parenchymal volume loss of the brain. 8. Extensive paranasal sinus disease. These results were called by telephone at the time of interpretation on 10/31/2017 at 8:21 pm to Dr. Wilford Corner, who verbally acknowledged these results. Electronically Signed   By: Mitzi Hansen M.D.   On: 10/31/2017 20:47      Assessment/Plan: Diagnosis: right pontine infarct with left hemiparesis 1. Does the need for close, 24 hr/day medical supervision in concert with the patient's rehab needs make it unreasonable for this patient to be served in a less intensive setting? Yes 2. Co-Morbidities requiring supervision/potential complications: hx of TBI 3. Due to bladder management, bowel management, safety, skin/wound care, disease management, medication administration, pain management and patient education, does the patient require 24 hr/day rehab nursing? Yes 4. Does the patient require coordinated care of a physician, rehab nurse, PT (1-2 hrs/day, 5 days/week), OT (1-2 hrs/day, 5 days/week) and SLP (1-2 hrs/day, 5 days/week) to address physical and functional deficits in the context of the above medical diagnosis(es)? Yes Addressing deficits in the following areas: balance, endurance, locomotion, strength, transferring, bowel/bladder control, bathing, dressing, feeding, grooming, toileting, speech and psychosocial support 5. Can the patient actively participate in an intensive therapy program of at least 3 hrs of therapy per day at least 5 days per week? Yes 6. The potential for patient to make measurable gains while on inpatient rehab is excellent 7. Anticipated functional outcomes upon discharge from inpatient rehab are modified independent  with PT, modified independent with OT, independent with SLP. 8. Estimated rehab length of stay to reach the above functional goals is: 14-20  days 9. Anticipated D/C setting: Home 10. Anticipated post D/C treatments: HH therapy and Outpatient therapy 11. Overall Rehab/Functional Prognosis: excellent  RECOMMENDATIONS: This patient's condition is appropriate for continued rehabilitative care in the following setting: CIR Patient has agreed to participate in recommended program. Yes Note that insurance prior authorization may be required for reimbursement for recommended care.  Comment: Rehab Admissions Coordinator to follow up.  Thanks,  Ranelle Oyster, MD, [place] [facility]  I have personally performed a face to face diagnostic evaluation of this patient. Additionally, I have reviewed and concur with the physician assistant's documentation above.     Mcarthur Rossetti Angiulli, PA-C 11/02/2017          Revision History                   Routing History
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
## 2017-11-03 NOTE — Progress Notes (Signed)
Pt arrived to unit via w/c with belongings at beside.  Family at bedside.  Pt is A&O, able to make needs known, speech is slurred, lungs clear, left upper and lower extremities are very weak, pt sitting up in recliner with call bell in reach.

## 2017-11-03 NOTE — Anesthesia Postprocedure Evaluation (Signed)
Anesthesia Post Note  Patient: Justin Cooke  Procedure(s) Performed: RADIOLOGY WITH ANESTHESIA (N/A )     Patient location during evaluation: ICU Anesthesia Type: MAC Level of consciousness: awake and alert Pain management: pain level controlled Vital Signs Assessment: post-procedure vital signs reviewed and stable Respiratory status: spontaneous breathing, nonlabored ventilation, respiratory function stable and patient connected to nasal cannula oxygen Cardiovascular status: stable and blood pressure returned to baseline Postop Assessment: no apparent nausea or vomiting Anesthetic complications: no    Last Vitals:  Vitals:   11/02/2017 0047 11/04/2017 0449  BP: 136/87 (!) 142/87  Pulse: 96 90  Resp: 16 16  Temp: 37.1 C 37.2 C  SpO2: 95% 99%    Last Pain:  Vitals:   11/16/2017 0449  TempSrc: Oral  PainSc:                  [place]
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
## 2017-11-04 ENCOUNTER — Other Ambulatory Visit: Payer: Self-pay

## 2017-11-04 ENCOUNTER — Inpatient Hospital Stay (HOSPITAL_COMMUNITY): Payer: PRIVATE HEALTH INSURANCE | Admitting: Speech Pathology

## 2017-11-04 ENCOUNTER — Inpatient Hospital Stay (HOSPITAL_COMMUNITY): Payer: PRIVATE HEALTH INSURANCE | Admitting: Physical Therapy

## 2017-11-04 ENCOUNTER — Inpatient Hospital Stay (HOSPITAL_COMMUNITY): Payer: PRIVATE HEALTH INSURANCE | Admitting: Occupational Therapy

## 2017-11-04 DIAGNOSIS — R7303 Prediabetes: Secondary | ICD-10-CM

## 2017-11-04 DIAGNOSIS — D72829 Elevated white blood cell count, unspecified: Secondary | ICD-10-CM

## 2017-11-04 DIAGNOSIS — G479 Sleep disorder, unspecified: Secondary | ICD-10-CM

## 2017-11-04 MED ORDER — TRAZODONE HCL 50 MG PO TABS
50.0000 mg | ORAL_TABLET | Freq: Every day | ORAL | Status: DC
  Administered 2017-11-04: 50 mg via ORAL
  Filled 2017-11-04 (×2): qty 1

## 2017-11-04 NOTE — Progress Notes (Signed)
[facility] [place] PHYSICAL MEDICINE & REHABILITATION     PROGRESS NOTE  Subjective/Complaints:  Patient seen lying in bed this morning He states he did not sleep well overnight and requested a sleep aid.  ROS: denies CP, SOB, nausea, vomiting, diarrhea.  Objective: Vital Signs: Blood pressure 137/89, pulse 78, temperature 98.7 F (37.1 C), temperature source Oral, resp. rate 18, weight 113.1 kg (249 lb 5.4 oz), SpO2 94 %. No results found. No results for input(s): WBC, HGB, HCT, PLT in the last 72 hours. No results for input(s): NA, K, CL, GLUCOSE, BUN, CREATININE, CALCIUM in the last 72 hours.  Invalid input(s): CO CBG (last 3)  No results for input(s): GLUCAP in the last 72 hours.  Wt Readings from Last 3 Encounters:  11/28/2017 113.1 kg (249 lb 5.4 oz)  11/01/17 117.1 kg (258 lb 2.5 oz)  11/08/12 112.9 kg (249 lb)    Physical Exam:  BP 137/89 (BP Location: Right Arm)   Pulse 78   Temp 98.7 F (37.1 C) (Oral)   Resp 18   Wt 113.1 kg (249 lb 5.4 oz)   SpO2 94%   BMI 32.01 kg/m  Constitutional: No distress . Vital signs reviewed. Obese HENT: Normocephalic.  Atraumatic. Eyes: EOMI. No discharge. Cardiovascular: RRR. No JVD. Respiratory: CTA Bilaterally. Normal effort. GI: BS +. Non-distended. Musc: No edema or tenderness in extremities. Neurological:  Alert Left central 7 Dysarthria Motor: LUE 2-/5 proximal to distal LLE: 1/5 proximal to distal  Skin: Skin is warm and dry. No rash noted. No erythema.  Psychiatric: He has a normal mood and affect. His behavior is normal.   Assessment/Plan: 1. 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 Physiatrist is providing close team supervision and 24 hour management of active medical problems listed below. Physiatrist and rehab team continue to assess barriers to  discharge/monitor patient progress toward functional and medical goals.  Function:  Bathing Bathing position      Bathing parts      Bathing assist        Upper Body Dressing/Undressing Upper body dressing                    Upper body assist        Lower Body Dressing/Undressing Lower body dressing                                  Lower body assist        Toileting Toileting          Toileting assist     Transfers Chair/bed transfer             Locomotion Ambulation           Wheelchair          Cognition Comprehension Comprehension assist level: Follows basic conversation/direction with no assist, Understands complex 90% of the time/cues 10% of the time  Expression Expression assist level: Expresses basic needs/ideas: With no assist  Social Interaction Social Interaction assist level: Interacts appropriately 90% of the time - Needs monitoring or encouragement for participation or interaction., Interacts appropriately with others with medication or extra time (anti-anxiety, antidepressant).  Problem Solving Problem solving assist level: Solves complex problems: With extra time  Memory Memory assist level: More than reasonable amount of time  Medical Problem List and Plan:  1. 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 Continue aspirin and Plavix x3 months then aspirin alone. Continue to monitor fusiform aneurysm follow-up neurology services outpatient for question plan for second opinion   Begin CIR   [facility] ordered   Notes reviewed, images reviewed, labs reviewed 2. DVT Prophylaxis/Anticoagulation: SCDs.  3. Pain Management: Tylenol as needed  4. Mood: Provide emotional support  5. Neuropsych: This patient is capable of making decisions on his own behalf.  6. Skin/Wound Care: Routine skin checks  7.  Fluids/Electrolytes/Nutrition: Routine I/O's   Labs pending 8. Hypertension. Norvasc 2.5 mg daily.   Monitor with increased mobility and adjust regimen accordingly.  9. Hyperlipidemia. Lipitor  10. Urine drug screen positive marijuana. Counseling  11. Obesity. BMI 33.15. Dietary follow-up  12. Prediabetes   Monitor with increased mobility 13. Leukocytosis  WBCs 10.7 on 4/30    afebrile  Labs pending 14. Sleep disturbance  Trazodone started on 5/4  LOS (Days) 1 A FACE TO FACE EVALUATION WAS PERFORMED  Justin Cooke Karis Juba 11/04/2017 11:12 AM
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
## 2017-11-04 NOTE — Plan of Care (Signed)
LTGs established 11/04/17
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
## 2017-11-04 NOTE — Plan of Care (Signed)
  Problem: RH PAIN MANAGEMENT Goal: RH STG PAIN MANAGED AT OR BELOW PT'S PAIN GOAL Description Pt will report pain of less than two with min assist  Outcome: Progressing  Pt rates pain score 0/10.
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
## 2017-11-05 ENCOUNTER — Inpatient Hospital Stay (HOSPITAL_COMMUNITY): Payer: PRIVATE HEALTH INSURANCE | Admitting: Certified Registered"

## 2017-11-05 ENCOUNTER — Inpatient Hospital Stay (HOSPITAL_COMMUNITY): Payer: PRIVATE HEALTH INSURANCE | Admitting: Occupational Therapy

## 2017-11-05 DIAGNOSIS — I469 Cardiac arrest, cause unspecified: Secondary | ICD-10-CM

## 2017-11-05 MED ORDER — TENECTEPLASE 50 MG IV KIT
50.0000 mg | PACK | INTRAVENOUS | Status: AC
  Administered 2017-11-05: 50 mg via INTRAVENOUS
  Filled 2017-11-05: qty 10

## 2017-11-05 MED ORDER — SODIUM CHLORIDE 0.9 % IV SOLN: INTRAVENOUS | Status: DC

## 2017-11-06 ENCOUNTER — Inpatient Hospital Stay (HOSPITAL_COMMUNITY): Payer: PRIVATE HEALTH INSURANCE | Admitting: Speech Pathology

## 2017-11-06 ENCOUNTER — Inpatient Hospital Stay (HOSPITAL_COMMUNITY): Payer: PRIVATE HEALTH INSURANCE | Admitting: Occupational Therapy

## 2017-11-06 ENCOUNTER — Inpatient Hospital Stay (HOSPITAL_COMMUNITY): Payer: PRIVATE HEALTH INSURANCE | Admitting: Physical Therapy

## 2017-11-08 ENCOUNTER — Telehealth: Payer: Self-pay

## 2017-11-08 NOTE — Telephone Encounter (Signed)
Justin Cooke called from the funeral and asked who was going to sign the death certificate.

## 2017-11-09 ENCOUNTER — Telehealth: Payer: Self-pay | Admitting: Physical Medicine & Rehabilitation

## 2017-11-09 MED FILL — Medication: Qty: 1 | Status: AC

## 2017-11-09 NOTE — Telephone Encounter (Signed)
Dr patel [facility] phoned office need death cert signed cal Raoul Pitch [phone] thanks

## 2017-12-02 NOTE — Progress Notes (Signed)
[facility] PHYSICAL MEDICINE & REHABILITATION     PROGRESS NOTE  Subjective/Complaints:  Presented to the room with patient unresponsive. CPR underway. Discussed with nursing, patient noted to be in normal state of health prior to code with nursing caring for and interacting with patient prior to episode. Please see code documentation. Discussed with code physicians. CPR performed for 30 minutes, patient and asystole majority of time, which did not respond to medications. Time of death: 640 AM.   ROS: Unresponsive  Objective: Vital Signs: Blood pressure (!) 152/91, pulse (!) 121, temperature 99.9 F (37.7 C), temperature source Oral, resp. rate 18, weight 113.1 kg (249 lb 5.4 oz), SpO2 95 %. No results found. No results for input(s): WBC, HGB, HCT, PLT in the last 72 hours. No results for input(s): NA, K, CL, GLUCOSE, BUN, CREATININE, CALCIUM in the last 72 hours.  Invalid input(s): CO CBG (last 3)  No results for input(s): GLUCAP in the last 72 hours.  Wt Readings from Last 3 Encounters:  11/12/2017 113.1 kg (249 lb 5.4 oz)  11/01/17 117.1 kg (258 lb 2.5 oz)  11/08/12 112.9 kg (249 lb)    Physical Exam:  BP (!) 152/91 (BP Location: Right Arm)   Pulse (!) 121   Temp 99.9 F (37.7 C) (Oral)   Resp 18   Wt 113.1 kg (249 lb 5.4 oz)   SpO2 95%   BMI 32.01 kg/m  Constitutional: unresponsive HENT: Normocephalic.  Atraumatic. Eyes: eyes shut Cardiovascular: no pulse Musc: No edema or tenderness in extremities. Neurological: unresponsive Skin: Skin is warm and dry.  Psychiatric: unresponsive  Assessment/Plan: 1. 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 Physiatrist is providing close team supervision and 24 hour management of active medical problems listed below. Physiatrist and rehab team continue to assess  barriers to discharge/monitor patient progress toward functional and medical goals.  Function:  Bathing Bathing position   Position: Shower  Bathing parts Body parts bathed by patient: Left arm, Chest, Abdomen, Front perineal area, Right upper leg, Left upper leg Body parts bathed by helper: Right arm, Buttocks, Right lower leg, Left lower leg, Back  Bathing assist Assist Level: (Mod A)      Upper Body Dressing/Undressing Upper body dressing   What is the patient wearing?: Pull over shirt/dress     Pull over shirt/dress - Perfomed by patient: Thread/unthread right sleeve, Put head through opening Pull over shirt/dress - Perfomed by helper: Thread/unthread left sleeve, Pull shirt over trunk        Upper body assist Assist Level: (Mod A)      Lower Body Dressing/Undressing Lower body dressing   What is the patient wearing?: Underwear, Pants, Non-skid slipper socks Underwear - Performed by patient: Thread/unthread right underwear leg Underwear - Performed by helper: Thread/unthread left underwear leg, Pull underwear up/down Pants- Performed by patient: Thread/unthread right pants leg Pants- Performed by helper: Thread/unthread left pants leg, Pull pants up/down   Non-skid slipper socks- Performed by helper: Don/doff right sock, Don/doff left sock                  Lower body assist Assist for lower body dressing: (Max A)      Toileting Toileting     Toileting steps completed by helper: Adjust clothing prior to toileting, Adjust clothing after toileting    Toileting assist Assist level: (1 helper)   Transfers  Chair/bed transfer   Chair/bed transfer method: Squat pivot Chair/bed transfer assist level: Moderate assist (Pt 50 - 74%/lift or lower) Chair/bed transfer assistive device: Armrests     Locomotion Ambulation     Max distance: 59ft  Assist level: 2 helpers   Wheelchair   Type: Manual Max wheelchair distance: 164ft  Assist Level: Touching or  steadying assistance (Pt > 75%)  Cognition Comprehension Comprehension assist level: Follows basic conversation/direction with no assist  Expression Expression assist level: Expresses basic needs/ideas: With no assist  Social Interaction Social Interaction assist level: Interacts appropriately 90% of the time - Needs monitoring or encouragement for participation or interaction.  Problem Solving Problem solving assist level: Solves complex problems: With extra time  Memory Memory assist level: More than reasonable amount of time    Medical Problem List and Plan:  1. 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 Continue aspirin and Plavix x3 months then aspirin alone. Continue to monitor fusiform aneurysm follow-up neurology services outpatient for question plan for second opinion   Time of death called at 6:40 AM after 30+ minutes of CPR in several doses of epinephrine  LOS (Days) 2 A FACE TO FACE EVALUATION WAS PERFORMED  Danique Hartsough Karis Juba 11/17/2017 7:28 AM

## 2017-12-02 NOTE — Procedures (Addendum)
RT responded to code. BMV by RT for 30 min. Patient intubated by CRNA Lyla Son. CPR continued per MD. Positive bilateral breath sounds, ETO2 on Zoll reading 0 throughout entire code.

## 2017-12-02 NOTE — Anesthesia Procedure Notes (Signed)
Procedure Name: Intubation Date/Time: 12-03-17 6:15 AM Performed by: Babs Bertin, CRNA Pre-anesthesia Checklist: Patient identified, Emergency Drugs available, Suction available and Patient being monitored Patient Re-evaluated:Patient Re-evaluated prior to induction Oxygen Delivery Method: Ambu bag Preoxygenation: Pre-oxygenation with 100% oxygen Laryngoscope Size: Mac and 4 Grade View: Grade II Tube type: Subglottic suction tube Tube size: 8.0 mm Number of attempts: 1 Airway Equipment and Method: Stylet and Oral airway Placement Confirmation: ETT inserted through vocal cords under direct vision,  positive ETCO2 and breath sounds checked- equal and bilateral Secured at: 23 cm Tube secured with: Tape Dental Injury: Teeth and Oropharynx as per pre-operative assessment

## 2017-12-02 NOTE — Progress Notes (Signed)
   2017/11/25 0700  Clinical Encounter Type  Visited With Patient  Visit Type Initial  Referral From Nurse  Consult/Referral To Chaplain    Pt coded. Medical staff on-site helping. No family member present but wife was called and arrived shortly. Chaplain provided emotional support and compassionate presence.  Bonnee Zertuche a Water quality scientist, [employer]

## 2017-12-02 NOTE — Death Summary Note (Signed)
Pt was last seen sleeping at 0445. NT in to take am vital signs at 0600 and pt was unresponsive. Writer notified by NT.  Pt was warm but without pulse or respirations CPR started at 0603. See code sheet for further information. Wife (Melody) called at [phone] to update on pt's change of condition and ongoing code. Chaplain aware of approximate time of family arrival. Dr. Allena Katz in the room at the beginning of the code.
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
## 2017-12-02 NOTE — IPOC Note (Signed)
Patient deceased prior to [facility].

## 2017-12-02 NOTE — Discharge Summary (Signed)
Discharge summary job # [id number]

## 2017-12-02 NOTE — Code Documentation (Signed)
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 Code blue was subsequently called. At the time of arrival on scene, ACLS protocol was underway.   Technical Description:  - CPR performance duration:  30 minutes  - Was defibrillation or cardioversion used? No   - Was external pacer placed? No  - Was patient intubated pre/post CPR? Yes, post    Medications Administered: Y = Yes; Blank = No Amiodarone    Atropine    Calcium    Epinephrine  y  Lidocaine    Magnesium    Norepinephrine    Phenylephrine    Sodium bicarbonate  y  Vasopressin    Other thrombolyitcs    Post CPR evaluation:  - Final Status - Was patient successfully resuscitated ? No   Miscellaneous Information:  - Time of death:  6:40 AM  - Primary team notified?  Yes  - Family Notified? Yes        Garnette Gunner, MD   12/04/2017, 6:14 AM

## 2017-12-02 NOTE — IPOC Note (Signed)
No IPOC given patient's passing.

## 2017-12-02 DEATH — deceased
